# Patient Record
Sex: Female | Born: 1970 | Race: White | Hispanic: No | Marital: Single | Smoking: Never smoker
Health system: Southern US, Community
[De-identification: ages and names within clinical notes are randomized; demographics above are authoritative.]

## PROBLEM LIST (undated history)

## (undated) DIAGNOSIS — K219 Gastro-esophageal reflux disease without esophagitis: Secondary | ICD-10-CM

## (undated) DIAGNOSIS — G4700 Insomnia, unspecified: Secondary | ICD-10-CM

## (undated) DIAGNOSIS — E039 Hypothyroidism, unspecified: Secondary | ICD-10-CM

## (undated) DIAGNOSIS — F319 Bipolar disorder, unspecified: Secondary | ICD-10-CM

## (undated) DIAGNOSIS — K589 Irritable bowel syndrome without diarrhea: Secondary | ICD-10-CM

## (undated) HISTORY — PX: OTHER SURGICAL HISTORY: SHX169

## (undated) HISTORY — DX: Insomnia, unspecified: G47.00

## (undated) HISTORY — DX: Hypothyroidism, unspecified: E03.9

## (undated) HISTORY — PX: CARPAL TUNNEL RELEASE: SHX101

## (undated) HISTORY — DX: Irritable bowel syndrome, unspecified: K58.9

## (undated) HISTORY — PX: VENTRAL HERNIA REPAIR: SHX424

## (undated) HISTORY — DX: Gastro-esophageal reflux disease without esophagitis: K21.9

---

## 2008-04-20 HISTORY — PX: TUBAL LIGATION: SHX77

## 2008-10-27 HISTORY — PX: LAPAROSCOPIC CHOLECYSTECTOMY: SUR755

## 2012-09-15 ENCOUNTER — Other Ambulatory Visit (HOSPITAL_COMMUNITY): Payer: Self-pay

## 2012-09-15 DIAGNOSIS — M545 Low back pain: Secondary | ICD-10-CM

## 2012-09-17 ENCOUNTER — Ambulatory Visit (HOSPITAL_COMMUNITY): Admission: RE | Admit: 2012-09-17 | Payer: Self-pay | Source: Ambulatory Visit

## 2012-12-08 ENCOUNTER — Other Ambulatory Visit: Payer: Self-pay | Admitting: Neurosurgery

## 2012-12-08 DIAGNOSIS — M545 Low back pain: Secondary | ICD-10-CM

## 2012-12-11 ENCOUNTER — Other Ambulatory Visit: Payer: Self-pay | Admitting: Neurosurgery

## 2012-12-11 ENCOUNTER — Ambulatory Visit
Admission: RE | Admit: 2012-12-11 | Discharge: 2012-12-11 | Disposition: A | Discharge status: Home / Self Care | Payer: Medicaid Other | Source: Ambulatory Visit | Attending: Neurosurgery | Admitting: Neurosurgery

## 2012-12-11 DIAGNOSIS — M545 Low back pain: Secondary | ICD-10-CM

## 2013-08-02 IMAGING — CR DG CERVICAL SPINE COMPLETE 4+V
7 series · 7 of 7 positions shown · non-contrast
Comparison: None.

CLINICAL DATA: Right-sided neck pain and right arm pain for 5
months.

CERVICAL SPINE - COMPLETE 4+ VIEW

[view not recorded (1 of 7)]
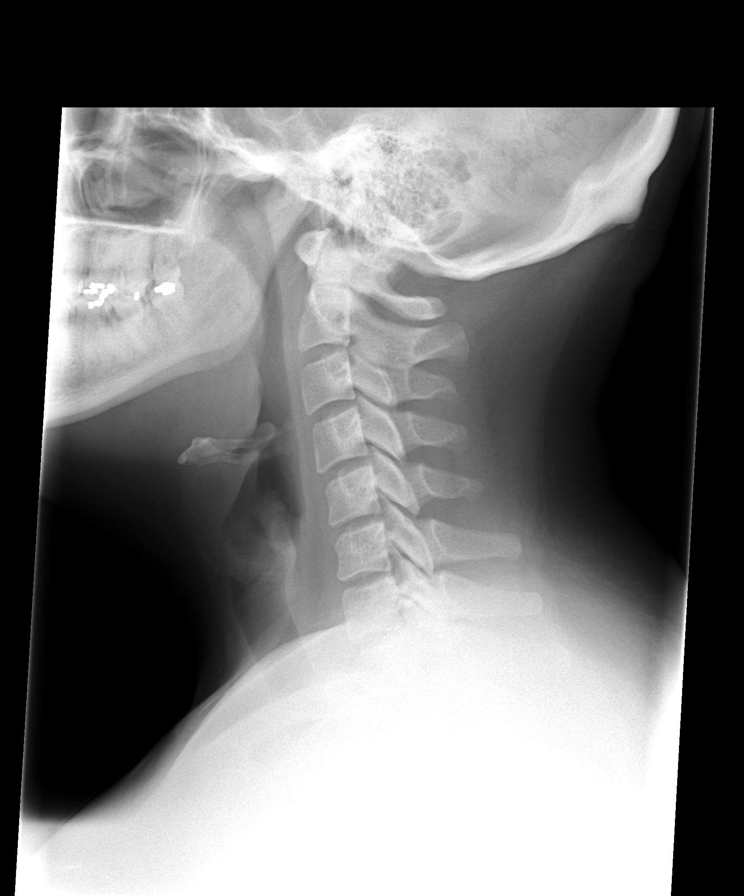

[view not recorded (2 of 7)]
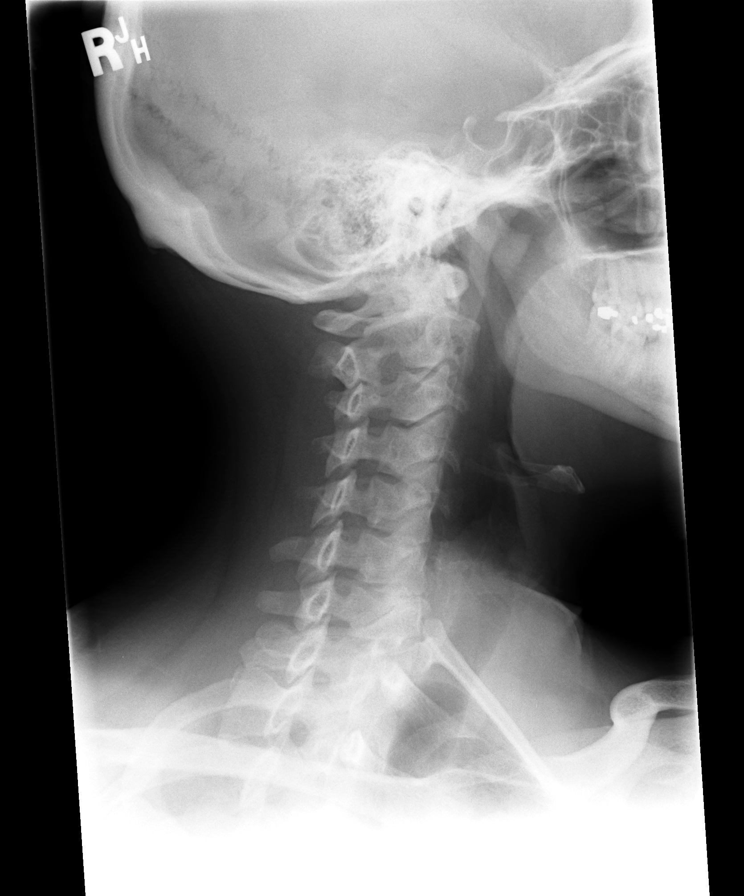

[view not recorded (3 of 7)]
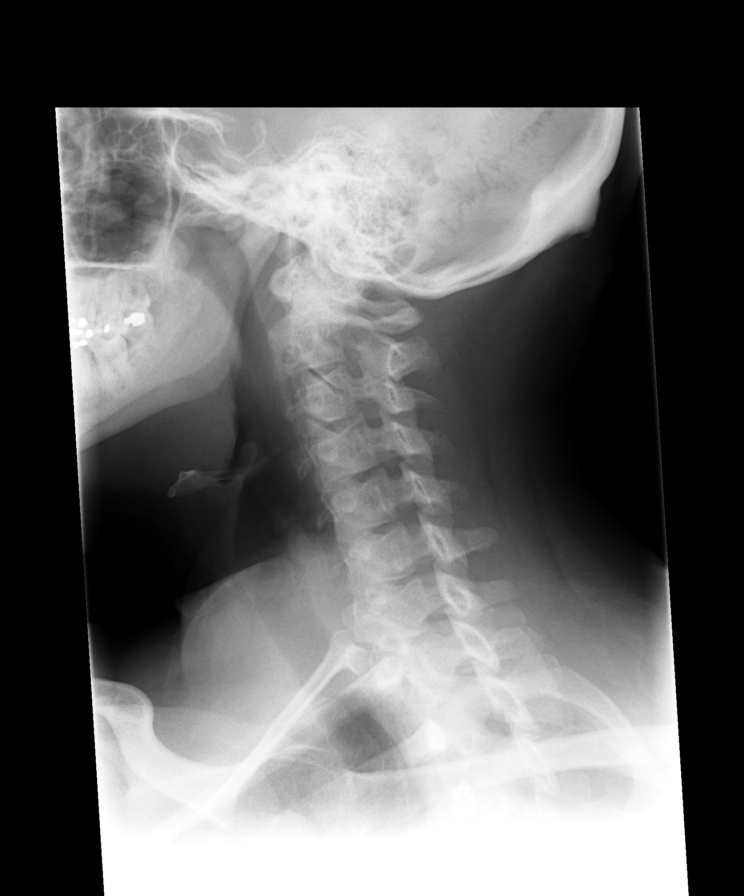

[view not recorded (4 of 7)]
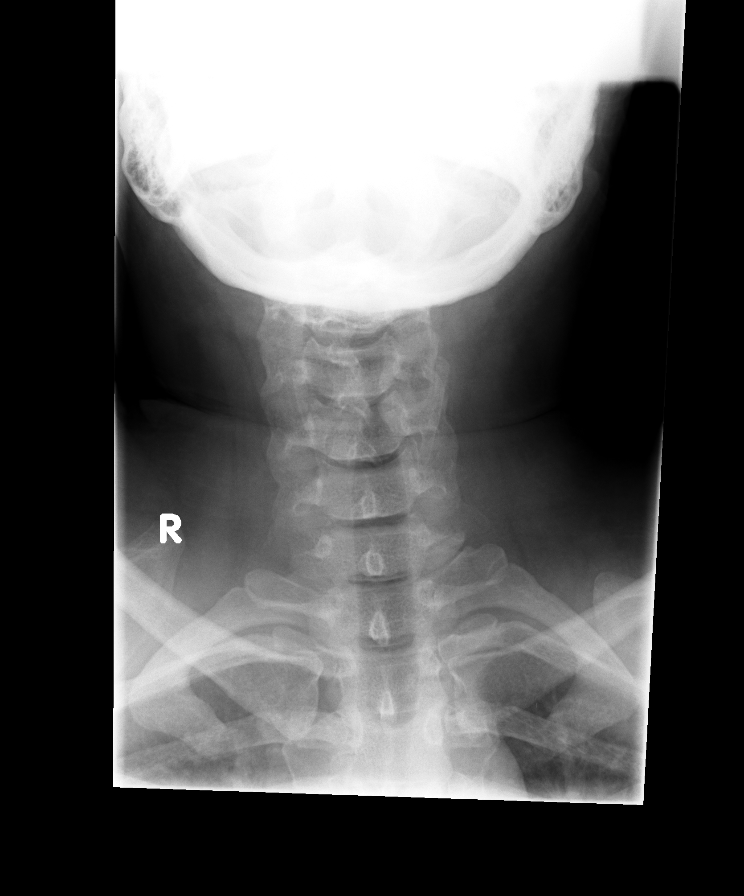

[view not recorded (5 of 7)]
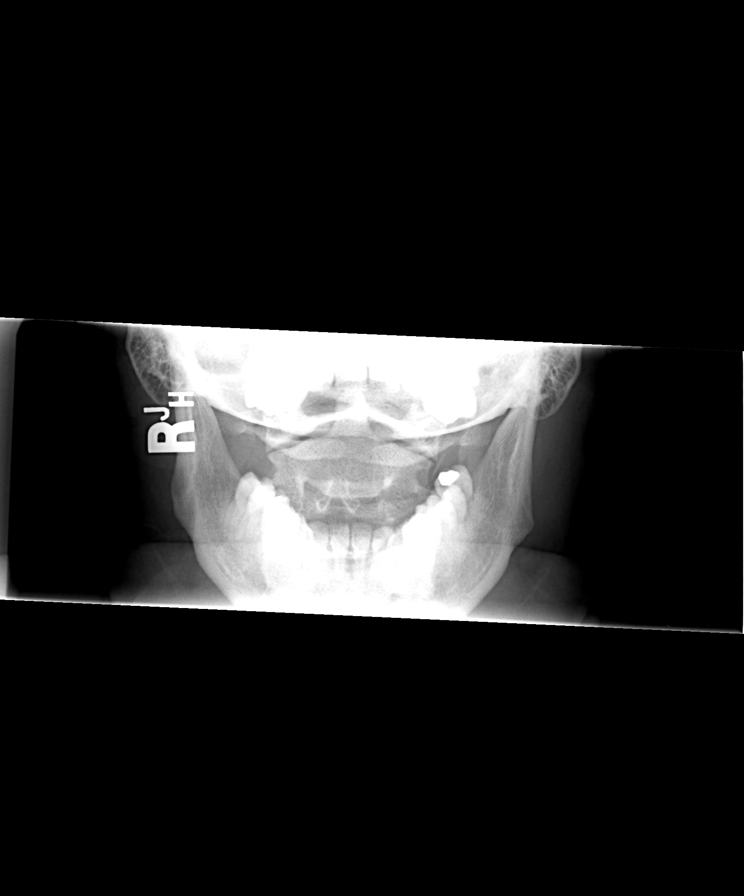

[view not recorded (6 of 7)]
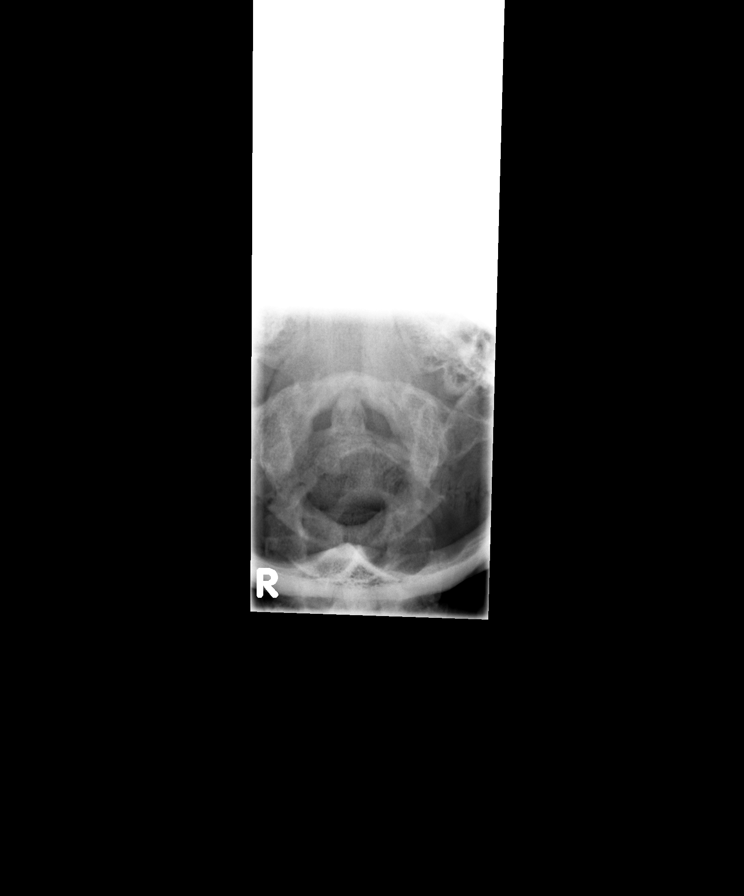

[view not recorded (7 of 7)]
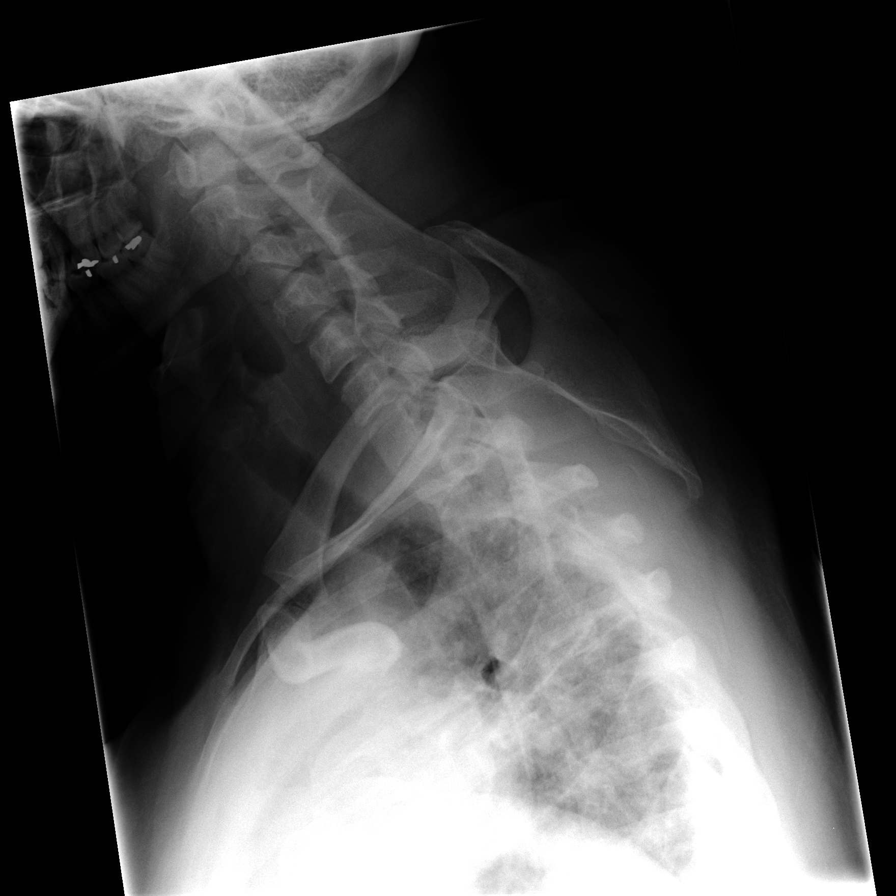

[7 of 7 positions shown; findings below may reference images not displayed]

FINDINGS: There is no disc space narrowing, arthritis, prevertebral
soft tissue swelling, or other abnormality. No foraminal stenosis.
IMPRESSION: Normal cervical spine.

## 2013-08-21 HISTORY — PX: COLONOSCOPY: SHX174

## 2015-03-06 ENCOUNTER — Emergency Department (HOSPITAL_COMMUNITY): Payer: Medicaid Other

## 2015-03-06 ENCOUNTER — Emergency Department (HOSPITAL_COMMUNITY)
Admission: EM | Admit: 2015-03-06 | Discharge: 2015-03-06 | Disposition: A | Discharge status: Home / Self Care | Payer: Medicaid Other | Attending: Emergency Medicine | Admitting: Emergency Medicine

## 2015-03-06 ENCOUNTER — Encounter (HOSPITAL_COMMUNITY): Payer: Self-pay | Admitting: *Deleted

## 2015-03-06 DIAGNOSIS — Y998 Other external cause status: Secondary | ICD-10-CM | POA: Insufficient documentation

## 2015-03-06 DIAGNOSIS — S46012A Strain of muscle(s) and tendon(s) of the rotator cuff of left shoulder, initial encounter: Secondary | ICD-10-CM | POA: Diagnosis not present

## 2015-03-06 DIAGNOSIS — S43422A Sprain of left rotator cuff capsule, initial encounter: Secondary | ICD-10-CM | POA: Insufficient documentation

## 2015-03-06 DIAGNOSIS — Y929 Unspecified place or not applicable: Secondary | ICD-10-CM | POA: Diagnosis not present

## 2015-03-06 DIAGNOSIS — X58XXXA Exposure to other specified factors, initial encounter: Secondary | ICD-10-CM | POA: Diagnosis not present

## 2015-03-06 DIAGNOSIS — Y939 Activity, unspecified: Secondary | ICD-10-CM | POA: Diagnosis not present

## 2015-03-06 DIAGNOSIS — S4992XA Unspecified injury of left shoulder and upper arm, initial encounter: Secondary | ICD-10-CM | POA: Diagnosis present

## 2015-03-06 MED ORDER — HYDROMORPHONE HCL 1 MG/ML IJ SOLN
1.0000 mg | Freq: Once | INTRAMUSCULAR | Status: AC
  Administered 2015-03-06: 1 mg via INTRAMUSCULAR
  Filled 2015-03-06: qty 1

## 2015-03-06 MED ORDER — HYDROCODONE-ACETAMINOPHEN 5-325 MG PO TABS: 1.0000 | ORAL_TABLET | ORAL | Status: DC | PRN

## 2015-03-06 MED ORDER — NAPROXEN 500 MG PO TABS: 500.0000 mg | ORAL_TABLET | Freq: Two times a day (BID) | ORAL | Status: DC

## 2015-03-06 NOTE — ED Notes (Signed)
Pt to xray

## 2015-03-06 NOTE — Discharge Instructions (Signed)
Rotator Cuff Injury Rotator cuff injury is any type of injury to the set of muscles and tendons that make up the stabilizing unit of your shoulder. This unit holds the ball of your upper arm bone (humerus) in the socket of your shoulder blade (scapula).  CAUSES Injuries to your rotator cuff most commonly come from sports or activities that cause your arm to be moved repeatedly over your head. Examples of this include throwing, weight lifting, swimming, or racquet sports. Long lasting (chronic) irritation of your rotator cuff can cause soreness and swelling (inflammation), bursitis, and eventual damage to your tendons, such as a tear (rupture). SIGNS AND SYMPTOMS Acute rotator cuff tear:  Sudden tearing sensation followed by severe pain shooting from your upper shoulder down your arm toward your elbow.  Decreased range of motion of your shoulder because of pain and muscle spasm.  Severe pain.  Inability to raise your arm out to the side because of pain and loss of muscle power (large tears). Chronic rotator cuff tear:  Pain that usually is worse at night and may interfere with sleep.  Gradual weakness and decreased shoulder motion as the pain worsens.  Decreased range of motion. Rotator cuff tendinitis:  Deep ache in your shoulder and the outside upper arm over your shoulder.  Pain that comes on gradually and becomes worse when lifting your arm to the side or turning it inward. DIAGNOSIS Rotator cuff injury is diagnosed through a medical history, physical exam, and imaging exam. The medical history helps determine the type of rotator cuff injury. Your health care provider will look at your injured shoulder, feel the injured area, and ask you to move your shoulder in different positions. X-ray exams typically are done to rule out other causes of shoulder pain, such as fractures. MRI is the exam of choice for the most severe shoulder injuries because the images show muscles and tendons.    TREATMENT  Chronic tear:  Medicine for pain, such as acetaminophen or ibuprofen.  Physical therapy and range-of-motion exercises may be helpful in maintaining shoulder function and strength.  Steroid injections into your shoulder joint.  Surgical repair of the rotator cuff if the injury does not heal with noninvasive treatment. Acute tear:  Anti-inflammatory medicines such as ibuprofen and naproxen to help reduce pain and swelling.  A sling to help support your arm and rest your rotator cuff muscles. Long-term use of a sling is not advised. It may cause significant stiffening of the shoulder joint.  Surgery may be considered within a few weeks, especially in younger, active people, to return the shoulder to full function.  Indications for surgical treatment include the following:  Age younger than 60 years.  Rotator cuff tears that are complete.  Physical therapy, rest, and anti-inflammatory medicines have been used for 6-8 weeks, with no improvement.  Employment or sporting activity that requires constant shoulder use. Tendinitis:  Anti-inflammatory medicines such as ibuprofen and naproxen to help reduce pain and swelling.  A sling to help support your arm and rest your rotator cuff muscles. Long-term use of a sling is not advised. It may cause significant stiffening of the shoulder joint.  Severe tendinitis may require:  Steroid injections into your shoulder joint.  Physical therapy.  Surgery. HOME CARE INSTRUCTIONS   Apply ice to your injury:  Put ice in a plastic bag.  Place a towel between your skin and the bag.  Leave the ice on for 20 minutes, 2-3 times a day.  If you   have a shoulder immobilizer (sling and straps), wear it until told otherwise by your health care provider.  You may want to sleep on several pillows or in a recliner at night to lessen swelling and pain.  Only take over-the-counter or prescription medicines for pain, discomfort, or fever as  directed by your health care provider.  Do simple hand squeezing exercises with a soft rubber ball to decrease hand swelling. SEEK MEDICAL CARE IF:   Your shoulder pain increases, or new pain or numbness develops in your arm, hand, or fingers.  Your hand or fingers are colder than your other hand. SEEK IMMEDIATE MEDICAL CARE IF:   Your arm, hand, or fingers are numb or tingling.  Your arm, hand, or fingers are increasingly swollen and painful, or they turn white or blue. MAKE SURE YOU:  Understand these instructions.  Will watch your condition.  Will get help right away if you are not doing well or get worse. Document Released: 11/02/2000 Document Revised: 11/10/2013 Document Reviewed: 06/17/2013 [employer] Patient Information 2015 [employer]. This information is not intended to replace advice given to you by your health care provider. Make sure you discuss any questions you have with your health care provider.  

## 2015-03-06 NOTE — ED Notes (Signed)
The pt uis c/o lkt shoulder pain for one year.  She woke uyp this am  With more pain than usual and some numbness and mimimal motiion.  shge is ahving this evaluated presently.  lmp  none
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
## 2015-05-11 ENCOUNTER — Emergency Department (HOSPITAL_COMMUNITY): Payer: Medicaid Other

## 2015-05-11 ENCOUNTER — Emergency Department (HOSPITAL_COMMUNITY)
Admission: EM | Admit: 2015-05-11 | Discharge: 2015-05-11 | Disposition: A | Discharge status: Home / Self Care | Payer: Medicaid Other | Attending: Emergency Medicine | Admitting: Emergency Medicine

## 2015-05-11 ENCOUNTER — Encounter (HOSPITAL_COMMUNITY): Payer: Self-pay | Admitting: *Deleted

## 2015-05-11 DIAGNOSIS — Z8659 Personal history of other mental and behavioral disorders: Secondary | ICD-10-CM | POA: Diagnosis not present

## 2015-05-11 DIAGNOSIS — R1032 Left lower quadrant pain: Secondary | ICD-10-CM | POA: Diagnosis not present

## 2015-05-11 DIAGNOSIS — Z90710 Acquired absence of both cervix and uterus: Secondary | ICD-10-CM | POA: Diagnosis not present

## 2015-05-11 DIAGNOSIS — R319 Hematuria, unspecified: Secondary | ICD-10-CM | POA: Insufficient documentation

## 2015-05-11 HISTORY — DX: Bipolar disorder, unspecified: F31.9

## 2015-05-11 LAB — CBC WITH DIFFERENTIAL/PLATELET
Basophils Absolute: 0 K/uL (ref 0.0–0.1)
Basophils Relative: 0 % (ref 0–1)
Eosinophils Absolute: 0.1 K/uL (ref 0.0–0.7)
Eosinophils Relative: 2 % (ref 0–5)
HCT: 36.5 % (ref 36.0–46.0)
Hemoglobin: 12.6 g/dL (ref 12.0–15.0)
Lymphocytes Relative: 26 % (ref 12–46)
Lymphs Abs: 1.9 K/uL (ref 0.7–4.0)
MCH: 31.6 pg (ref 26.0–34.0)
MCHC: 34.5 g/dL (ref 30.0–36.0)
MCV: 91.5 fL (ref 78.0–100.0)
Monocytes Absolute: 0.5 K/uL (ref 0.1–1.0)
Monocytes Relative: 7 % (ref 3–12)
Neutro Abs: 4.7 K/uL (ref 1.7–7.7)
Neutrophils Relative %: 65 % (ref 43–77)
Platelets: 290 K/uL (ref 150–400)
RBC: 3.99 MIL/uL (ref 3.87–5.11)
RDW: 12.2 % (ref 11.5–15.5)
WBC: 7.3 K/uL (ref 4.0–10.5)

## 2015-05-11 LAB — COMPREHENSIVE METABOLIC PANEL WITH GFR
ALT: 22 U/L (ref 14–54)
AST: 21 U/L (ref 15–41)
Albumin: 3.9 g/dL (ref 3.5–5.0)
Alkaline Phosphatase: 60 U/L (ref 38–126)
Anion gap: 9 (ref 5–15)
BUN: 12 mg/dL (ref 6–20)
CO2: 26 mmol/L (ref 22–32)
Calcium: 9.5 mg/dL (ref 8.9–10.3)
Chloride: 107 mmol/L (ref 101–111)
Creatinine, Ser: 0.64 mg/dL (ref 0.44–1.00)
GFR calc Af Amer: >60 mL/min
GFR calc non Af Amer: >60 mL/min
Glucose, Bld: 86 mg/dL (ref 65–99)
Potassium: 3.7 mmol/L (ref 3.5–5.1)
Sodium: 142 mmol/L (ref 135–145)
Total Bilirubin: 0.5 mg/dL (ref 0.3–1.2)
Total Protein: 6.9 g/dL (ref 6.5–8.1)

## 2015-05-11 LAB — URINALYSIS, ROUTINE W REFLEX MICROSCOPIC
Bilirubin Urine: NEGATIVE
Glucose, UA: NEGATIVE mg/dL
Ketones, ur: NEGATIVE mg/dL
Leukocytes, UA: NEGATIVE
Nitrite: NEGATIVE
Protein, ur: NEGATIVE mg/dL
Specific Gravity, Urine: 1.017 (ref 1.005–1.030)
Urobilinogen, UA: 0.2 mg/dL (ref 0.0–1.0)
pH: 6 (ref 5.0–8.0)

## 2015-05-11 LAB — URINE MICROSCOPIC-ADD ON

## 2015-05-11 LAB — LIPASE, BLOOD: Lipase: 32 U/L (ref 22–51)

## 2015-05-11 MED ORDER — IOHEXOL 300 MG/ML  SOLN
100.0000 mL | Freq: Once | INTRAMUSCULAR | Status: AC | PRN
  Administered 2015-05-11: 100 mL via INTRAVENOUS

## 2015-05-11 MED ORDER — IBUPROFEN 800 MG PO TABS: 800.0000 mg | ORAL_TABLET | Freq: Three times a day (TID) | ORAL | Status: AC

## 2015-05-11 MED ORDER — HYDROCODONE-ACETAMINOPHEN 5-325 MG PO TABS: 1.0000 | ORAL_TABLET | Freq: Four times a day (QID) | ORAL | Status: AC | PRN

## 2015-05-11 MED ORDER — HYDROMORPHONE HCL 1 MG/ML IJ SOLN
1.0000 mg | Freq: Once | INTRAMUSCULAR | Status: AC
  Administered 2015-05-11: 1 mg via INTRAVENOUS
  Filled 2015-05-11: qty 1

## 2015-05-11 MED ORDER — ONDANSETRON HCL 4 MG/2ML IJ SOLN
4.0000 mg | Freq: Once | INTRAMUSCULAR | Status: AC
  Administered 2015-05-11: 4 mg via INTRAVENOUS
  Filled 2015-05-11: qty 2

## 2015-05-11 MED ORDER — IOHEXOL 300 MG/ML  SOLN
25.0000 mL | Freq: Once | INTRAMUSCULAR | Status: AC | PRN
  Administered 2015-05-11: 25 mL via ORAL

## 2015-05-11 MED ORDER — SODIUM CHLORIDE 0.9 % IV SOLN
INTRAVENOUS | Status: DC
  Administered 2015-05-11: 17:00:00 via INTRAVENOUS

## 2015-05-11 NOTE — ED Provider Notes (Signed)
CSN: [id number]     Arrival date & time 05/11/15  1525 History   This chart was scribed for non-physician practitioner,Mandee Pluta M. Damian Leavell, NP, working with Bethann Berkshire, MD, by Budd Palmer ED Scribe. This patient was seen in room TR09C/TR09C and the patient's care was started at 4:20 PM    Chief Complaint  Patient presents with  . Abdominal Pain   The history is provided by the patient. No language interpreter was used.   HPI Comments: Jennifer Hutchinson is a 44 y.o. female who presents to the Emergency Department complaining of progressively worsening, cramping, continuous, left-sided abdominal pain, onset in 11/2014.  She also notes intermittent radiation to her left lower back and down her left leg.  The patient notes a noticeable abdominal bulge if she strains while using the restroom.  The pain is relieved by lying supine, and applying heat. Physical strain and standing up exacerbates the pain. She reports a history of complete laparoscopic abdominal hysterectomy for endometriosis 10/2014, as well as a C-section 7 years ago.   Past Medical History  Diagnosis Date  . Bipolar 1 disorder    History reviewed. No pertinent past surgical history. History reviewed. No pertinent family history. History  Substance Use Topics  . Smoking status: Never Smoker   . Smokeless tobacco: Not on file  . Alcohol Use: Yes   OB History    No data available     Review of Systems  Gastrointestinal: Positive for abdominal pain.  Skin: Negative for wound.  all other systems negative    Allergies  Lamictal  Home Medications   Prior to Admission medications   Medication Sig Start Date End Date Taking? Authorizing Provider  HYDROcodone-acetaminophen (NORCO) 5-325 MG per tablet Take 1 tablet by mouth every 6 (six) hours as needed for moderate pain. 05/11/15   Johaan Ryser Orlene Och, NP  ibuprofen (ADVIL,MOTRIN) 800 MG tablet Take 1 tablet (800 mg total) by mouth 3 (three) times daily. 05/11/15   Brandon Scarbrough Orlene Och, NP    BP 111/62 mmHg  Pulse 61  Temp(Src) 97.9 F (36.6 C) (Oral)  Resp 20  Ht  (1.626 m)  Wt 175 lb (79.379 kg)  BMI 30.02 kg/m2  SpO2 100% Physical Exam  Constitutional: She is oriented to person, place, and time. She appears well-developed and well-nourished. No distress.  HENT:  Head: Normocephalic and atraumatic.  Eyes: Conjunctivae and EOM are normal.  Neck: Normal range of motion. Neck supple. No tracheal deviation present.  Cardiovascular: Normal rate and regular rhythm.   Pulmonary/Chest: Effort normal and breath sounds normal.  Lungs are clear  Abdominal: Soft. Bowel sounds are normal. There is tenderness (LLQ). There is no rebound and no guarding.  Genitourinary:  No CVA tenderness  Musculoskeletal: Normal range of motion.  Neurological: She is alert and oriented to person, place, and time.  Skin: Skin is warm and dry.  Psychiatric: She has a normal mood and affect. Her behavior is normal.  Nursing note and vitals reviewed.   ED Course  Procedures  DIAGNOSTIC STUDIES: Oxygen Saturation is 100% on RA, normal by my interpretation.    COORDINATION OF CARE: 4:24 PM - Discussed plans to order pain medication, and possible CT of abdomen. Pt advised of plan for treatment and pt agrees.  Labs Review Results for orders placed or performed during the hospital encounter of 05/11/15 (from the past 24 hour(s))  Urinalysis, Routine w reflex microscopic (not at [facility])     Status: Abnormal   Collection  Time: 05/11/15  3:35 PM  Result Value Ref Range   Color, Urine YELLOW YELLOW   APPearance CLEAR CLEAR   Specific Gravity, Urine 1.017 1.005 - 1.030   pH 6.0 5.0 - 8.0   Glucose, UA NEGATIVE NEGATIVE mg/dL   Hgb urine dipstick TRACE (A) NEGATIVE   Bilirubin Urine NEGATIVE NEGATIVE   Ketones, ur NEGATIVE NEGATIVE mg/dL   Protein, ur NEGATIVE NEGATIVE mg/dL   Urobilinogen, UA 0.2 0.0 - 1.0 mg/dL   Nitrite NEGATIVE NEGATIVE   Leukocytes, UA NEGATIVE NEGATIVE  Urine  microscopic-add on     Status: Abnormal   Collection Time: 05/11/15  3:35 PM  Result Value Ref Range   Squamous Epithelial / LPF FEW (A) RARE   WBC, UA 0-2 <3 WBC/hpf   RBC / HPF 0-2 <3 RBC/hpf   Bacteria, UA RARE RARE  CBC WITH DIFFERENTIAL     Status: None   Collection Time: 05/11/15  4:00 PM  Result Value Ref Range   WBC 7.3 4.0 - 10.5 K/uL   RBC 3.99 3.87 - 5.11 MIL/uL   Hemoglobin 12.6 12.0 - 15.0 g/dL   HCT 96.0 45.4 - 09.8 %   MCV 91.5 78.0 - 100.0 fL   MCH 31.6 26.0 - 34.0 pg   MCHC 34.5 30.0 - 36.0 g/dL   RDW 11.9 14.7 - 82.9 %   Platelets 290 150 - 400 K/uL   Neutrophils Relative % 65 43 - 77 %   Neutro Abs 4.7 1.7 - 7.7 K/uL   Lymphocytes Relative 26 12 - 46 %   Lymphs Abs 1.9 0.7 - 4.0 K/uL   Monocytes Relative 7 3 - 12 %   Monocytes Absolute 0.5 0.1 - 1.0 K/uL   Eosinophils Relative 2 0 - 5 %   Eosinophils Absolute 0.1 0.0 - 0.7 K/uL   Basophils Relative 0 0 - 1 %   Basophils Absolute 0.0 0.0 - 0.1 K/uL  Comprehensive metabolic panel     Status: None   Collection Time: 05/11/15  4:00 PM  Result Value Ref Range   Sodium 142 135 - 145 mmol/L   Potassium 3.7 3.5 - 5.1 mmol/L   Chloride 107 101 - 111 mmol/L   CO2 26 22 - 32 mmol/L   Glucose, Bld 86 65 - 99 mg/dL   BUN 12 6 - 20 mg/dL   Creatinine, Ser 5.62 0.44 - 1.00 mg/dL   Calcium 9.5 8.9 - 13.0 mg/dL   Total Protein 6.9 6.5 - 8.1 g/dL   Albumin 3.9 3.5 - 5.0 g/dL   AST 21 15 - 41 U/L   ALT 22 14 - 54 U/L   Alkaline Phosphatase 60 38 - 126 U/L   Total Bilirubin 0.5 0.3 - 1.2 mg/dL   GFR calc non Af Amer >60 >60 mL/min   GFR calc Af Amer >60 >60 mL/min   Anion gap 9 5 - 15  Lipase, blood     Status: None   Collection Time: 05/11/15  4:00 PM  Result Value Ref Range   Lipase 32 22 - 51 U/L     Imaging Review Ct Abdomen Pelvis W Contrast  05/11/2015   CLINICAL DATA:  Left lower quadrant pain since January. Diarrhea 2 weeks ago. Hysterectomy in December.  EXAM: CT ABDOMEN AND PELVIS WITH CONTRAST   TECHNIQUE: Multidetector CT imaging of the abdomen and pelvis was performed using the standard protocol following bolus administration of intravenous contrast.  CONTRAST:  OMNIPAQUE IOHEXOL 300 MG/ML  SOLN  COMPARISON:  None.  FINDINGS: Lower chest: 4 mm anterior right lung base nodule on image 2. Subpleural 3 mm left lower lobe pulmonary nodule on image 6. Normal heart size without pericardial or pleural effusion.  Hepatobiliary: Normal liver. Cholecystectomy, without biliary ductal dilatation.  Pancreas: Normal, without mass or ductal dilatation.  Spleen: Normal  Adrenals/Urinary Tract: Normal adrenal glands. Too small to characterize lesions in both kidneys, likely cysts. Punctate left renal collecting system calculus on coronal image 57. No hydronephrosis. Normal ureters and urinary bladder.  Stomach/Bowel: Normal stomach, without wall thickening. Colonic stool burden suggests constipation. Normal terminal ileum and appendix. Normal small bowel.  Vascular/Lymphatic: Normal caliber of the aorta and branch vessels. No abdominopelvic adenopathy.  Reproductive: Hysterectomy.  No adnexal mass.  Other: No significant free fluid.  Musculoskeletal: L4-5 and L5-S1 mild disc bulges.  IMPRESSION: 1. Left nephrolithiasis. 2.  Possible constipation. 3. Bibasilar lung nodules up to 4 mm. If the patient is at high risk for bronchogenic carcinoma, follow-up chest CT at 1 year is recommended. If the patient is at low risk, no follow-up is needed. This recommendation follows the consensus statement: "Guidelines for Management of Small Pulmonary Nodules Detected on CT Scans: A Statement from the Fleischner Society" as published in Radiology 2005; 237:395-400. Available online at: DietDisorder.cz.   Electronically Signed   By: Jeronimo Greaves M.D.   On: 05/11/2015 17:47   I discussed this case with Dr. Estell Harpin and he reviewed the CT scan. Patient may have passed a stone. Will have her  follow up with urology since there are stones noted in the kidney. Will have patient follow up with her PCP for the basilar lung nodules.   MDM  44 y.o. female with LLQ abdominal pain and hematuria. Stable for d/c without obstruction. Discussed with the patient clinical, lab and CT findings and plan of care. All questioned fully answered. She will follow up with urology for the hematuria and with her PCP for the pulmonary nodules. She will return here if any problems arise.  Final diagnoses:  Abdominal pain, left lower quadrant  Hematuria   I personally performed the services described in this documentation, which was scribed in my presence. The recorded information has been reviewed and is accurate.   [address] [place], [place] 05/11/15 2332  Bethann Berkshire, MD 05/13/15 [phone]

## 2015-05-11 NOTE — ED Notes (Signed)
Pt in c/o swollen area to RLQ, near her c-section scar, first noted a few weeks ago and it has been progressively worse, increased pain, area used to reduce in swelling but now it is staying out, reports diarrhea, no vomiting, no distress noted

## 2015-05-11 NOTE — Discharge Instructions (Signed)
Take a stool softener and eat a diet high in fiber.    Abdominal Pain, Women Abdominal (stomach, pelvic, or belly) pain can be caused by many things. It is important to tell your doctor:  The location of the pain.  Does it come and go or is it present all the time?  Are there things that start the pain (eating certain foods, exercise)?  Are there other symptoms associated with the pain (fever, nausea, vomiting, diarrhea)? All of this is helpful to know when trying to find the cause of the pain. CAUSES   Stomach: virus or bacteria infection, or ulcer.  Intestine: appendicitis (inflamed appendix), regional ileitis (Crohn's disease), ulcerative colitis (inflamed colon), irritable bowel syndrome, diverticulitis (inflamed diverticulum of the colon), or cancer of the stomach or intestine.  Gallbladder disease or stones in the gallbladder.  Kidney disease, kidney stones, or infection.  Pancreas infection or cancer.  Fibromyalgia (pain disorder).  Diseases of the female organs:  Uterus: fibroid (non-cancerous) tumors or infection.  Fallopian tubes: infection or tubal pregnancy.  Ovary: cysts or tumors.  Pelvic adhesions (scar tissue).  Endometriosis (uterus lining tissue growing in the pelvis and on the pelvic organs).  Pelvic congestion syndrome (female organs filling up with blood just before the menstrual period).  Pain with the menstrual period.  Pain with ovulation (producing an egg).  Pain with an IUD (intrauterine device, birth control) in the uterus.  Cancer of the female organs.  Functional pain (pain not caused by a disease, may improve without treatment).  Psychological pain.  Depression. DIAGNOSIS  Your doctor will decide the seriousness of your pain by doing an examination.  Blood tests.  X-rays.  Ultrasound.  CT scan (computed tomography, special type of X-ray).  MRI (magnetic resonance imaging).  Cultures, for infection.  Barium enema (dye  inserted in the large intestine, to better view it with X-rays).  Colonoscopy (looking in intestine with a lighted tube).  Laparoscopy (minor surgery, looking in abdomen with a lighted tube).  Major abdominal exploratory surgery (looking in abdomen with a large incision). TREATMENT  The treatment will depend on the cause of the pain.   Many cases can be observed and treated at home.  Over-the-counter medicines recommended by your caregiver.  Prescription medicine.  Antibiotics, for infection.  Birth control pills, for painful periods or for ovulation pain.  Hormone treatment, for endometriosis.  Nerve blocking injections.  Physical therapy.  Antidepressants.  Counseling with a psychologist or psychiatrist.  Minor or major surgery. HOME CARE INSTRUCTIONS   Do not take laxatives, unless directed by your caregiver.  Take over-the-counter pain medicine only if ordered by your caregiver. Do not take aspirin because it can cause an upset stomach or bleeding.  Try a clear liquid diet (broth or water) as ordered by your caregiver. Slowly move to a bland diet, as tolerated, if the pain is related to the stomach or intestine.  Have a thermometer and take your temperature several times a day, and record it.  Bed rest and sleep, if it helps the pain.  Avoid sexual intercourse, if it causes pain.  Avoid stressful situations.  Keep your follow-up appointments and tests, as your caregiver orders.  If the pain does not go away with medicine or surgery, you may try:  Acupuncture.  Relaxation exercises (yoga, meditation).  Group therapy.  Counseling. SEEK MEDICAL CARE IF:   You notice certain foods cause stomach pain.  Your home care treatment is not helping your pain.  You need  stronger pain medicine.  You want your IUD removed.  You feel faint or lightheaded.  You develop nausea and vomiting.  You develop a rash.  You are having side effects or an allergy to  your medicine. SEEK IMMEDIATE MEDICAL CARE IF:   Your pain does not go away or gets worse.  You have a fever.  Your pain is felt only in portions of the abdomen. The right side could possibly be appendicitis. The left lower portion of the abdomen could be colitis or diverticulitis.  You are passing blood in your stools (bright red or black tarry stools, with or without vomiting).  You have blood in your urine.  You develop chills, with or without a fever.  You pass out. MAKE SURE YOU:   Understand these instructions.  Will watch your condition.  Will get help right away if you are not doing well or get worse. Document Released: 09/02/2007 Document Revised: 03/22/2014 Document Reviewed: 09/22/2009 [facility] Patient Information 2015 [place], [place]. This information is not intended to replace advice given to you by your health care provider. Make sure you discuss any questions you have with your health care provider.

## 2015-05-11 NOTE — ED Notes (Signed)
NP at bedside.
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
## 2019-11-19 ENCOUNTER — Encounter: Payer: Self-pay | Admitting: Gastroenterology

## 2019-12-08 ENCOUNTER — Ambulatory Visit: Payer: Self-pay | Admitting: Gastroenterology

## 2020-01-14 ENCOUNTER — Encounter: Payer: Self-pay | Admitting: Family Medicine

## 2023-10-01 ENCOUNTER — Encounter: Payer: Self-pay | Admitting: Gastroenterology

## 2024-11-19 ENCOUNTER — Other Ambulatory Visit: Payer: Self-pay

## 2024-11-19 ENCOUNTER — Observation Stay (HOSPITAL_COMMUNITY)
Admission: EM | Admit: 2024-11-19 | Discharge: 2024-11-21 | Disposition: A | Attending: Internal Medicine | Admitting: Internal Medicine

## 2024-11-19 ENCOUNTER — Encounter (HOSPITAL_COMMUNITY): Payer: Self-pay

## 2024-11-19 DIAGNOSIS — R109 Unspecified abdominal pain: Secondary | ICD-10-CM | POA: Diagnosis present

## 2024-11-19 DIAGNOSIS — K219 Gastro-esophageal reflux disease without esophagitis: Secondary | ICD-10-CM | POA: Diagnosis present

## 2024-11-19 DIAGNOSIS — R911 Solitary pulmonary nodule: Secondary | ICD-10-CM | POA: Insufficient documentation

## 2024-11-19 DIAGNOSIS — R748 Abnormal levels of other serum enzymes: Secondary | ICD-10-CM | POA: Diagnosis not present

## 2024-11-19 DIAGNOSIS — R918 Other nonspecific abnormal finding of lung field: Secondary | ICD-10-CM | POA: Diagnosis present

## 2024-11-19 DIAGNOSIS — N200 Calculus of kidney: Secondary | ICD-10-CM

## 2024-11-19 DIAGNOSIS — Z79899 Other long term (current) drug therapy: Secondary | ICD-10-CM | POA: Insufficient documentation

## 2024-11-19 DIAGNOSIS — K5900 Constipation, unspecified: Secondary | ICD-10-CM | POA: Diagnosis present

## 2024-11-19 DIAGNOSIS — F319 Bipolar disorder, unspecified: Secondary | ICD-10-CM | POA: Diagnosis not present

## 2024-11-19 DIAGNOSIS — R7401 Elevation of levels of liver transaminase levels: Secondary | ICD-10-CM | POA: Diagnosis not present

## 2024-11-19 DIAGNOSIS — R1084 Generalized abdominal pain: Secondary | ICD-10-CM | POA: Diagnosis present

## 2024-11-19 DIAGNOSIS — E039 Hypothyroidism, unspecified: Secondary | ICD-10-CM | POA: Insufficient documentation

## 2024-11-19 LAB — URINALYSIS, ROUTINE W REFLEX MICROSCOPIC
Bacteria, UA: NONE SEEN
Bilirubin Urine: NEGATIVE
Glucose, UA: NEGATIVE mg/dL
Hgb urine dipstick: NEGATIVE
Ketones, ur: NEGATIVE mg/dL
Nitrite: NEGATIVE
Protein, ur: NEGATIVE mg/dL
Specific Gravity, Urine: 1.01 (ref 1.005–1.030)
pH: 5 (ref 5.0–8.0)

## 2024-11-19 LAB — COMPREHENSIVE METABOLIC PANEL WITH GFR
ALT: 61 U/L — ABNORMAL HIGH (ref 0–44)
AST: 67 U/L — ABNORMAL HIGH (ref 15–41)
Albumin: 4.2 g/dL (ref 3.5–5.0)
Alkaline Phosphatase: 84 U/L (ref 38–126)
Anion gap: 11 (ref 5–15)
BUN: 13 mg/dL (ref 6–20)
CO2: 26 mmol/L (ref 22–32)
Calcium: 9.7 mg/dL (ref 8.9–10.3)
Chloride: 100 mmol/L (ref 98–111)
Creatinine, Ser: 0.9 mg/dL (ref 0.44–1.00)
GFR, Estimated: >60 mL/min
Glucose, Bld: 116 mg/dL — ABNORMAL HIGH (ref 70–99)
Potassium: 4.1 mmol/L (ref 3.5–5.1)
Sodium: 137 mmol/L (ref 135–145)
Total Bilirubin: 0.4 mg/dL (ref 0.0–1.2)
Total Protein: 7.1 g/dL (ref 6.5–8.1)

## 2024-11-19 LAB — CBC WITH DIFFERENTIAL/PLATELET
Abs Immature Granulocytes: 0.02 K/uL (ref 0.00–0.07)
Basophils Absolute: 0 K/uL (ref 0.0–0.1)
Basophils Relative: 0 %
Eosinophils Absolute: 0.1 K/uL (ref 0.0–0.5)
Eosinophils Relative: 2 %
HCT: 40.8 % (ref 36.0–46.0)
Hemoglobin: 14 g/dL (ref 12.0–15.0)
Immature Granulocytes: 0 %
Lymphocytes Relative: 28 %
Lymphs Abs: 1.6 K/uL (ref 0.7–4.0)
MCH: 30.8 pg (ref 26.0–34.0)
MCHC: 34.3 g/dL (ref 30.0–36.0)
MCV: 89.9 fL (ref 80.0–100.0)
Monocytes Absolute: 0.4 K/uL (ref 0.1–1.0)
Monocytes Relative: 7 %
Neutro Abs: 3.5 K/uL (ref 1.7–7.7)
Neutrophils Relative %: 63 %
Platelets: 332 K/uL (ref 150–400)
RBC: 4.54 MIL/uL (ref 3.87–5.11)
RDW: 11.8 % (ref 11.5–15.5)
WBC: 5.7 K/uL (ref 4.0–10.5)
nRBC: 0 % (ref 0.0–0.2)

## 2024-11-19 LAB — LIPASE, BLOOD: Lipase: 213 U/L — ABNORMAL HIGH (ref 11–51)

## 2024-11-19 NOTE — ED Triage Notes (Signed)
 C/O left flank pain that radiates to neck that started December 7th. C/O nausea/vomiting/SHOB/dizziness. Denies urinary issues. Both feet and hands swelling.

## 2024-11-19 NOTE — ED Provider Triage Note (Signed)
 Emergency Medicine Provider Triage Evaluation Note  Jennifer Hutchinson , a 54 y.o. female  was evaluated in triage.  Pt complains of abdominal pain, flank pain that has been present since December 7 and has been worsening.  Patient states that she was seen at multiple hospitals and told she might of had a kidney stone, but it was unclear.  Patient also states she had an episode of LOC yesterday and has been feeling short of breath since yesterday.  She states that she did go to [facility] a few days ago and was told that she had colitis and has been taking the antibiotics as prescribed.  However, patient states the pain has been getting worse and she is unable to eat, drink and has been feeling nauseous since.  Patient states the pain is unbearable.  Review of Systems  Positive: Abdominal pain, nausea, lightheadedness, dizziness, recent LOC today Negative:   Physical Exam  There were no vitals taken for this visit. Gen:   Awake, no distress   Resp:  Tachypneic MSK:   Moves extremities without difficulty  Other:  Abdomen soft and relatively nontender.  No CVA tenderness  Medical Decision Making  Medically screening exam initiated at 2:55 PM.  Appropriate orders placed.  Danea Quizon was informed that the remainder of the evaluation will be completed by another provider, this initial triage assessment does not replace that evaluation, and the importance of remaining in the ED until their evaluation is complete.  Labs, UA ordered   Torrence Marry RAMAN, PA-C 11/19/24 1500

## 2024-11-20 ENCOUNTER — Emergency Department (HOSPITAL_COMMUNITY)

## 2024-11-20 DIAGNOSIS — R1030 Lower abdominal pain, unspecified: Secondary | ICD-10-CM | POA: Diagnosis not present

## 2024-11-20 DIAGNOSIS — K5900 Constipation, unspecified: Secondary | ICD-10-CM

## 2024-11-20 DIAGNOSIS — R7401 Elevation of levels of liver transaminase levels: Secondary | ICD-10-CM

## 2024-11-20 DIAGNOSIS — R748 Abnormal levels of other serum enzymes: Secondary | ICD-10-CM | POA: Diagnosis not present

## 2024-11-20 DIAGNOSIS — R918 Other nonspecific abnormal finding of lung field: Secondary | ICD-10-CM

## 2024-11-20 DIAGNOSIS — F319 Bipolar disorder, unspecified: Secondary | ICD-10-CM | POA: Diagnosis not present

## 2024-11-20 DIAGNOSIS — K219 Gastro-esophageal reflux disease without esophagitis: Secondary | ICD-10-CM

## 2024-11-20 DIAGNOSIS — N200 Calculus of kidney: Secondary | ICD-10-CM

## 2024-11-20 DIAGNOSIS — R109 Unspecified abdominal pain: Secondary | ICD-10-CM | POA: Diagnosis present

## 2024-11-20 MED ORDER — TAMSULOSIN HCL 0.4 MG PO CAPS
0.4000 mg | ORAL_CAPSULE | Freq: Every day | ORAL | Status: DC
  Administered 2024-11-20 – 2024-11-21 (×2): 0.4 mg via ORAL
  Filled 2024-11-20 (×3): qty 1

## 2024-11-20 MED ORDER — FENTANYL CITRATE (PF) 50 MCG/ML IJ SOSY
50.0000 ug | PREFILLED_SYRINGE | Freq: Once | INTRAMUSCULAR | Status: AC
  Administered 2024-11-20: 50 ug via INTRAVENOUS
  Filled 2024-11-20: qty 1

## 2024-11-20 MED ORDER — SODIUM CHLORIDE 0.9 % IV SOLN: INTRAVENOUS | Status: DC

## 2024-11-20 MED ORDER — ACETAMINOPHEN 325 MG PO TABS: 650.0000 mg | ORAL_TABLET | Freq: Four times a day (QID) | ORAL | Status: DC | PRN

## 2024-11-20 MED ORDER — PANTOPRAZOLE SODIUM 40 MG IV SOLR
40.0000 mg | Freq: Two times a day (BID) | INTRAVENOUS | Status: DC
  Administered 2024-11-20 – 2024-11-21 (×3): 40 mg via INTRAVENOUS
  Filled 2024-11-20 (×3): qty 10

## 2024-11-20 MED ORDER — FENTANYL CITRATE (PF) 50 MCG/ML IJ SOSY: 50.0000 ug | PREFILLED_SYRINGE | INTRAMUSCULAR | Status: DC | PRN

## 2024-11-20 MED ORDER — ACETAMINOPHEN 650 MG RE SUPP: 650.0000 mg | Freq: Four times a day (QID) | RECTAL | Status: DC | PRN

## 2024-11-20 MED ORDER — ALBUTEROL SULFATE (2.5 MG/3ML) 0.083% IN NEBU: 2.5000 mg | INHALATION_SOLUTION | Freq: Four times a day (QID) | RESPIRATORY_TRACT | Status: DC | PRN

## 2024-11-20 MED ORDER — POLYETHYLENE GLYCOL 3350 17 GM/SCOOP PO POWD
119.0000 g | Freq: Once | ORAL | Status: AC
  Administered 2024-11-20: 119 g via ORAL
  Filled 2024-11-20: qty 119

## 2024-11-20 MED ORDER — MORPHINE SULFATE (PF) 4 MG/ML IV SOLN
4.0000 mg | Freq: Once | INTRAVENOUS | Status: AC
  Administered 2024-11-20: 4 mg via INTRAVENOUS
  Filled 2024-11-20: qty 1

## 2024-11-20 MED ORDER — IOHEXOL 350 MG/ML SOLN
75.0000 mL | Freq: Once | INTRAVENOUS | Status: AC | PRN
  Administered 2024-11-20: 75 mL via INTRAVENOUS

## 2024-11-20 MED ORDER — OXYCODONE HCL 5 MG PO TABS: 5.0000 mg | ORAL_TABLET | Freq: Four times a day (QID) | ORAL | Status: DC | PRN

## 2024-11-20 MED ORDER — LACTATED RINGERS IV BOLUS
1000.0000 mL | Freq: Once | INTRAVENOUS | Status: AC
  Administered 2024-11-20: 1000 mL via INTRAVENOUS

## 2024-11-20 MED ORDER — ONDANSETRON HCL 4 MG/2ML IJ SOLN
4.0000 mg | Freq: Once | INTRAMUSCULAR | Status: AC
  Administered 2024-11-20: 4 mg via INTRAVENOUS
  Filled 2024-11-20: qty 2

## 2024-11-20 MED ORDER — ONDANSETRON HCL 4 MG/2ML IJ SOLN: 4.0000 mg | Freq: Four times a day (QID) | INTRAMUSCULAR | Status: DC | PRN

## 2024-11-20 MED ORDER — FENTANYL CITRATE (PF) 50 MCG/ML IJ SOSY: 25.0000 ug | PREFILLED_SYRINGE | INTRAMUSCULAR | Status: DC | PRN

## 2024-11-20 MED ORDER — ONDANSETRON HCL 4 MG PO TABS: 4.0000 mg | ORAL_TABLET | Freq: Four times a day (QID) | ORAL | Status: DC | PRN

## 2024-11-20 MED ORDER — SODIUM CHLORIDE 0.9% FLUSH
3.0000 mL | Freq: Two times a day (BID) | INTRAVENOUS | Status: DC
  Administered 2024-11-20 – 2024-11-21 (×3): 3 mL via INTRAVENOUS

## 2024-11-20 MED ORDER — PHENAZOPYRIDINE HCL 200 MG PO TABS
200.0000 mg | ORAL_TABLET | Freq: Three times a day (TID) | ORAL | Status: DC
  Administered 2024-11-20 – 2024-11-21 (×2): 200 mg via ORAL
  Filled 2024-11-20 (×3): qty 1

## 2024-11-20 MED ORDER — OXYCODONE HCL 5 MG PO TABS
5.0000 mg | ORAL_TABLET | ORAL | Status: DC | PRN
  Filled 2024-11-20: qty 1

## 2024-11-20 MED ORDER — ENOXAPARIN SODIUM 40 MG/0.4ML IJ SOSY
40.0000 mg | PREFILLED_SYRINGE | INTRAMUSCULAR | Status: DC
  Administered 2024-11-20: 40 mg via SUBCUTANEOUS
  Filled 2024-11-20: qty 0.4

## 2024-11-20 NOTE — ED Notes (Signed)
 Pts pain got worse after she ate this am. Endorses pain front through to back. 10/10 pain

## 2024-11-20 NOTE — Consult Note (Addendum)
 "   Consultation  Referring Provider: ER MD/[facility] [place] Primary Care Physician:  Will Rosella, MD Primary Gastroenterologist: [place]  Reason for Consultation: Abdominal pain, elevated LFTs  HPI: Jennifer Hutchinson is a 54 y.o. female with history of previous cholecystectomy, hysterectomy prior ventral hernia repair, history of IBS, hypothyroidism, GERD, and bipolar disorder.  She presented to the emergency room here stating that she has been having persistent abdominal pain since Saturday, 11/15/2024.  On conversation with the patient she says she is primarily feeling the pain in her very low abdomen and here in her very low back.  She says the back pain feels like back labor at times. She had been somewhat frustrated as she had been to 2 other emergency rooms over the past few weeks, and came here because she did not feel comfortable with her diagnoses. She had initially been to the emergency room in [place]  on 10/25/2024.  Had a couple of falls recently and had wanted to be evaluated for that.  At that time she was told that on imaging she had any stones she was started on Flomax.  She had had a urine culture done and that came back positive for E. coli and she was subsequently started on Bactrim which she completed x 10 days. She had also been seen in the emergency room at [facility] after that and had CT imaging and was told that she had colitis and not kidney stones.  She was then given a course of Cipro and Flagyl and Bentyl. She did not feel that those medications were helping her.  Continues on Flomax at This Point and Has Been Taking Some Prescription Pain Medication.  She Does Admit to Being Constipated Which Is Unusual for Her, Having Very Hard Stools Small-Volume.  She Has Not Noted Any Melena or Hematochezia. CT Imaging abdomen and pelvis with contrast shows a 2 mm peripheral nodule in the right middle lobe, and a 2 mm nodule in the left lower lobe, and a 3 to 4 mm  nodule laterally in the left lower lobe, unremarkable liver, status postcholecystectomy no ductal dilation, simple exophytic cyst arising from the left kidney, and bilateral nonobstructing nephrolithiasis.  No evidence of any bowel wall thickening or bowel abnormality.  Labs 11/19/2024-WBC 5.7/hemoglobin 14/hematocrit 40.8/platelets 332 Lipase was 213 LFTs showed T. bili of 0.4/alk phos 84/AST 67/ALT 61 BUN 13/creatinine 0.9  UA today small leukocytes, 0-5 WBC 0-5 RBCs  We were also asked to address the elevated LFTs, as there was concern with her pain regarding potential choledocholithiasis.   Past Medical History:  Diagnosis Date   Bipolar 1 disorder (HCC)    GERD (gastroesophageal reflux disease)    Hypothyroidism    IBS (irritable bowel syndrome)    Insomnia     Past Surgical History:  Procedure Laterality Date   birthmark removal Right    Right arm   CARPAL TUNNEL RELEASE     CESAREAN SECTION     COLONOSCOPY  08/21/2013   Small internal hemorrhoids (the most likely etiology of bright red blood per rectum). Otherwise normal colonoscopy to terminal ileum   LAPAROSCOPIC CHOLECYSTECTOMY  10/27/2008   TUBAL LIGATION  04/20/2008   VENTRAL HERNIA REPAIR     08/25/2007, 08/05/2009 Dr Gerard. Incarcerated ventral hernia    Prior to Admission medications  Medication Sig Start Date End Date Taking? Authorizing Provider  ciprofloxacin (CIPRO) 500 MG tablet Take 500 mg by mouth 2 (two) times daily. 11/16/24  Yes [provider]  dicyclomine (BENTYL) 20 MG tablet Take 20 mg by mouth every 6 (six) hours. 11/16/24  Yes [provider]  HYDROcodone -acetaminophen  (NORCO) 5-325 MG per tablet Take 1 tablet by mouth every 6 (six) hours as needed for moderate pain. 05/11/15  Yes Neese, Hope M, NP  ibuprofen  (ADVIL ,MOTRIN ) 800 MG tablet Take 1 tablet (800 mg total) by mouth 3 (three) times daily. 05/11/15  Yes Neese, Hope M, NP  metroNIDAZOLE (FLAGYL) 500 MG tablet Take 500 mg  by mouth 2 (two) times daily. 11/16/24  Yes [provider]  ondansetron  (ZOFRAN ) 8 MG tablet Take 8 mg by mouth every 8 (eight) hours as needed. 10/29/24  Yes [provider]  ondansetron  (ZOFRAN -ODT) 4 MG disintegrating tablet Take 4 mg by mouth every 8 (eight) hours as needed.   Yes [provider]  pregabalin (LYRICA) 75 MG capsule Take 75 mg by mouth. 09/29/24 11/28/24 Yes [provider]  Vitamin D, Ergocalciferol, (DRISDOL) 1.25 MG (50000 UNIT) CAPS capsule Take 50,000 Units by mouth once a week. 08/26/24 08/26/25 Yes [provider]  sulfamethoxazole-trimethoprim (BACTRIM DS) 800-160 MG tablet Take 1 tablet by mouth 2 (two) times daily. Patient not taking: Reported on 11/20/2024 10/29/24   [provider]  tamsulosin (FLOMAX) 0.4 MG CAPS capsule Take 0.4 mg by mouth daily. Patient not taking: Reported on 11/20/2024 10/29/24   [provider]    Current Facility-Administered Medications  Medication Dose Route Frequency Provider Last Rate Last Admin   0.9 %  sodium chloride  infusion   Intravenous Continuous Claudene Maximino LABOR, MD 75 mL/hr at 11/20/24 1548 New Bag at 11/20/24 1548   acetaminophen  (TYLENOL ) tablet 650 mg  650 mg Oral Q6H PRN Smith, Rondell A, MD       Or   acetaminophen  (TYLENOL ) suppository 650 mg  650 mg Rectal Q6H PRN Smith, Rondell A, MD       albuterol (PROVENTIL) (2.5 MG/3ML) 0.083% nebulizer solution 2.5 mg  2.5 mg Nebulization Q6H PRN Claudene, Rondell A, MD       enoxaparin (LOVENOX) injection 40 mg  40 mg Subcutaneous Q24H Smith, Rondell A, MD       fentaNYL (SUBLIMAZE) injection 50 mcg  50 mcg Intravenous Q2H PRN Smith, Rondell A, MD       ondansetron  (ZOFRAN ) tablet 4 mg  4 mg Oral Q6H PRN Claudene Maximino LABOR, MD       Or   ondansetron  (ZOFRAN ) injection 4 mg  4 mg Intravenous Q6H PRN Smith, Rondell A, MD       oxyCODONE (Oxy IR/ROXICODONE) immediate release tablet 5 mg  5 mg Oral Q4H PRN Smith, Rondell A, MD        pantoprazole (PROTONIX) injection 40 mg  40 mg Intravenous Q12H Smith, Rondell A, MD       phenazopyridine (PYRIDIUM) tablet 200 mg  200 mg Oral TID WC Smith, Rondell A, MD       polyethylene glycol powder (GLYCOLAX/MIRALAX) container 119 g  119 g Oral Once Smith, Rondell A, MD       sodium chloride  flush (NS) 0.9 % injection 3 mL  3 mL Intravenous Q12H Smith, Rondell A, MD   3 mL at 11/20/24 1501   tamsulosin (FLOMAX) capsule 0.4 mg  0.4 mg Oral Daily Claudene Maximino A, MD        Allergies as of 11/19/2024 - Review Complete 11/19/2024  Allergen Reaction Noted   Lamictal [lamotrigine]  03/06/2015    History reviewed. No pertinent family history.  Social History   Socioeconomic History   Marital status: Single    Spouse name: Not on file   Number of children: Not on file   Years of education: Not on file   Highest education level: Not on file  Occupational History   Not on file  Tobacco Use   Smoking status: Never   Smokeless tobacco: Not on file  Substance and Sexual Activity   Alcohol use: Yes   Drug use: Not on file   Sexual activity: Not on file  Other Topics Concern   Not on file  Social History Narrative   Not on file   Social Drivers of Health   Tobacco Use: Unknown (11/19/2024)   Patient History    Smoking Tobacco Use: Never    Smokeless Tobacco Use: Unknown    Passive Exposure: Not on file  Financial Resource Strain: Not on file  Food Insecurity: Food Insecurity Present (11/20/2024)   Epic    Worried About Programme Researcher, Broadcasting/film/video in the Last Year: Sometimes true    Ran Out of Food in the Last Year: Sometimes true  Transportation Needs: No Transportation Needs (11/20/2024)   Epic    Lack of Transportation (Medical): No    Lack of Transportation (Non-Medical): No  Physical Activity: Not on file  Stress: Not on file  Social Connections: Not on file  Intimate Partner Violence: Not At Risk (11/20/2024)   Epic    Fear of Current or Ex-Partner: No    Emotionally  Abused: No    Physically Abused: No    Sexually Abused: No  Depression (PHQ2-9): Not on file  Alcohol Screen: Not on file  Housing: High Risk (11/20/2024)   Epic    Unable to Pay for Housing in the Last Year: Yes    Number of Times Moved in the Last Year: 0    Homeless in the Last Year: No  Utilities: Patient Declined (11/20/2024)   Epic    Threatened with loss of utilities: Patient declined  Health Literacy: Not on file    Review of Systems: Pertinent positive and negative review of systems were noted in the above HPI section.  All other review of systems was otherwise negative.   Physical Exam: Vital signs in last 24 hours: Temp:  [97.5 F (36.4 C)-98.5 F (36.9 C)] 98.3 F (36.8 C) (01/02 1523) Pulse Rate:  [57-74] 66 (01/02 1523) Resp:  [16-22] 16 (01/02 1157) BP: (108-142)/(71-91) 142/91 (01/02 1523) SpO2:  [96 %-100 %] 100 % (01/02 1523)   General:   Alert,  Well-developed, well-nourished, older white female pleasant and cooperative in NAD Head:  Normocephalic and atraumatic. Eyes:  Sclera clear, no icterus.   Conjunctiva pink. Ears:  Normal auditory acuity. Nose:  No deformity, discharge,  or lesions. Mouth:  No deformity or lesions.   Neck:  Supple; no masses or thyromegaly. Lungs:  Clear throughout to auscultation.   No wheezes, crackles, or rhonchi . Heart:  Regular rate and rhythm; no murmurs, clicks, rubs,  or gallops. Abdomen: There is some mild tenderness in the suprapubic area, no guarding or rebound, no palpable mass or hepatosplenomegaly, bowel sounds are present Rectal: Not done Msk:  Symmetrical without gross deformities. . Pulses:  Normal pulses noted. Extremities:  Without clubbing or edema. Neurologic:  Alert and  oriented x4;  grossly normal neurologically. Skin:  Intact without significant lesions or rashes.. Psych:  Alert and cooperative. Normal mood and affect.  Intake/Output from previous day: No intake/output data recorded.  Intake/Output this  shift: Total I/O In: 1000.2 [IV Piggyback:1000.2] Out: -   Lab Results: Recent Labs    11/19/24 1529  WBC 5.7  HGB 14.0  HCT 40.8  PLT 332   BMET Recent Labs    11/19/24 1529  NA 137  K 4.1  CL 100  CO2 26  GLUCOSE 116*  BUN 13  CREATININE 0.90  CALCIUM 9.7   LFT Recent Labs    11/19/24 1529  PROT 7.1  ALBUMIN 4.2  AST 67*  ALT 61*  ALKPHOS 84  BILITOT 0.4   PT/INR No results for input(s): LABPROT, INR in the last 72 hours. Hepatitis Panel No results for input(s): HEPBSAG, HCVAB, HEPAIGM, HEPBIGM in the last 72 hours.   IMPRESSION:  #48 54 year old white female who has been having lower abdominal pain and low back pain over the past 3 weeks Initial ER visit [facility]  with diagnosis of kidney stones, started on Flomax, also diagnosed at that time with an E. coli UTI and given a 10-day course of Bactrim.  Apparently with persistent symptoms had gone to the emergency room at [facility] several days later.  Had repeat CT imaging and was told that she may have some colitis and was given an empiric course of Cipro and Flagyl and Bentyl.  He has completed the course of Bactrim, did not feel that the Bentyl was helping her.  Not been having any dysuria until over the past 24 hours.  Came to the ER here as she was still hurting and uncomfortable with her diagnoses.  She does have a urology appointment pending in [place].  CT imaging here does not show any evidence of colitis, no ductal dilation, she has nephrolithiasis but no ureterolithiasis appreciated She does have a lot of retained stool throughout her colon  Suspect that she did have a UTI, may still have some cystitis type symptoms but does not have an active UTI currently.  Unclear whether or not she may have passed a small kidney stone but no current ureterolithiasis  #2 mild transaminitis-this is not consistent with choledocholithiasis, AST 65/ALT 61  Likely drug-induced  mild transaminitis secondary to Bactrim  Plan; patient is to be admitted to observation Suggest bowel purge with a MiraLAX Gatorade prep or Nulytely Advised patient to minimize use of narcotics moving forward due to obstipation Stop Bentyl GI will not plan to follow, available if needed, hopefully she can be discharged tomorrow     Amy EsterwoodPA-C  11/20/2024, 4:47 PM  I have taken an interval history, thoroughly reviewed the chart and examined the patient. I agree with the Advanced Practitioner's note, impression and recommendations, and have recorded additional findings, impressions and recommendations below. I performed a substantive portion of this encounter (>50% time spent), including a complete performance of the medical decision making.  I also personally reviewed the CT abdomen and pelvis images from today and from the recent ED visit and [place], [place].  I do not think there was any true colitis on the [place] CT  My additional thoughts are as follows:  Overall clinical picture favors this patient having a combination of chronic lumbar sacral pain and what sounds like a recent urinary tract infection that is now cleared.  I suspect she then got constipated from pain medicine and dicyclomine (large amount of retained fecal material on today's CT scan). Mild nonspecific transaminitis (which is new from some normal LFTs from July of this year found in Care Everywhere) seem most likely related  to recent antibiotic use, particularly Bactrim.  Recommendations as above _________________  This consultation required a moderate degree of medical decision making due to the nature and complexity of the acute condition(s) being evaluated as well as the patient's medical comorbidities.  Victory LITTIE Brand III [phone]     "

## 2024-11-20 NOTE — ED Provider Notes (Signed)
 " [place] EMERGENCY DEPARTMENT AT [facility] Provider Note   CSN: [id number] Arrival date & time: 11/19/24  1451     Patient presents with: Abdominal Pain   Roseline Bixler is a 54 y.o. female.   Patient is a 54 year old female with past medical history of bipolar disorder, GERD, prior cholecystectomy and hysterectomy presenting to the emergency department with abdominal pain.  Patient states that her pain started on Saturday.  She states that sometimes it is in her lower abdomen and sometimes it is her upper abdomen and she can also feel the pain diffusely.  She states that it radiates into her low back.  She states that it feels like contractions.  She endorses associated nausea, denies any vomiting.  She states that she has felt feverish but has not had a measured fever.  She states that she has been constipated.  She states that she was seen at an outside ED on Saturday as well as Monday.  She states that she was initially diagnosed with a kidney stone and started on Flomax but then was told on Monday that she did not have a stone and that she had colitis.  She states that the Flomax was stopped and she was started on antibiotics.  She states that despite taking the antibiotics that she has had no improvement of her symptoms.  She states that she has also been taking Bentyl and Norco for pain at home.  The history is provided by the patient and the spouse.  Abdominal Pain      Prior to Admission medications  Medication Sig Start Date End Date Taking? Authorizing Provider  HYDROcodone -acetaminophen  (NORCO) 5-325 MG per tablet Take 1 tablet by mouth every 6 (six) hours as needed for moderate pain. 05/11/15   Jamelle Lorrayne HERO, NP  ibuprofen  (ADVIL ,MOTRIN ) 800 MG tablet Take 1 tablet (800 mg total) by mouth 3 (three) times daily. 05/11/15   Jamelle Lorrayne HERO, NP    Allergies: Lamictal [lamotrigine]    Review of Systems  Gastrointestinal:  Positive for abdominal pain.    Updated  Vital Signs BP 118/85 (BP Location: Left Arm)   Pulse 74   Temp 98.3 F (36.8 C) (Temporal)   Resp 16   Ht 5' 4 (1.626 m)   Wt 79.4 kg   SpO2 96%   BMI 30.04 kg/m   Physical Exam Vitals and nursing note reviewed.  Constitutional:      General: She is not in acute distress.    Appearance: She is well-developed.  HENT:     Head: Normocephalic.     Mouth/Throat:     Mouth: Mucous membranes are moist.  Eyes:     Extraocular Movements: Extraocular movements intact.  Cardiovascular:     Rate and Rhythm: Normal rate and regular rhythm.     Heart sounds: Normal heart sounds.  Pulmonary:     Effort: Pulmonary effort is normal.     Breath sounds: Normal breath sounds.  Abdominal:     General: Abdomen is flat.     Palpations: Abdomen is soft.     Tenderness: There is generalized abdominal tenderness. There is guarding. There is no right CVA tenderness, left CVA tenderness or rebound.  Skin:    General: Skin is warm and dry.  Neurological:     General: No focal deficit present.     Mental Status: She is alert and oriented to person, place, and time.  Psychiatric:        Mood  and Affect: Mood normal.        Behavior: Behavior normal.     (all labs ordered are listed, but only abnormal results are displayed) Labs Reviewed  COMPREHENSIVE METABOLIC PANEL WITH GFR - Abnormal; Notable for the following components:      Result Value   Glucose, Bld 116 (*)    AST 67 (*)    ALT 61 (*)    All other components within normal limits  LIPASE, BLOOD - Abnormal; Notable for the following components:   Lipase 213 (*)    All other components within normal limits  URINALYSIS, ROUTINE W REFLEX MICROSCOPIC - Abnormal; Notable for the following components:   Leukocytes,Ua SMALL (*)    All other components within normal limits  CBC WITH DIFFERENTIAL/PLATELET    EKG: None  Radiology: CT ABDOMEN PELVIS W CONTRAST Result Date: 11/20/2024 EXAM: CT ABDOMEN AND PELVIS WITH CONTRAST  11/20/2024 03:43:41 AM TECHNIQUE: CT of the abdomen and pelvis was performed with the administration of intravenous contrast. 75 mL of iohexol  (OMNIPAQUE ) 350 MG/ML injection was administered. Multiplanar reformatted images are provided for review. Automated exposure control, iterative reconstruction, and/or weight-based adjustment of the mA/kV was utilized to reduce the radiation dose to as low as reasonably achievable. COMPARISON: CT of the abdomen and pelvis dated 11/14/2024. CLINICAL HISTORY: Abdominal pain, acute, nonlocalized. FINDINGS: LOWER CHEST: Mild dependent atelectasis. There is a 2 mm peripheral nodule again demonstrated laterally within the right middle lobe on image 1 of series 5. There is also a 2 mm nodule present anterolaterally within the base of the left lower lobe on image 3. There is a 3 to 4 mm nodule present laterally within the left lower lobe on image 7. LIVER: The liver is unremarkable. GALLBLADDER AND BILE DUCTS: The patient is status post cholecystectomy. No biliary ductal dilatation. SPLEEN: No acute abnormality. PANCREAS: No acute abnormality. ADRENAL GLANDS: No acute abnormality. KIDNEYS, URETERS AND BLADDER: There is a simple exophytic cyst arising medially from the superior pole of the left kidney. There are also small simple cysts present within the right kidney. There is a 2 to 3 mm nonobstructive calculus also present within the lower pole of the right kidney. There are also a couple of 2 to 3 mm nonobstructive calculi within the lower pole of the left kidney. There is no evidence of obstructive uropathy. No hydronephrosis. No perinephric or periureteral stranding. Urinary bladder is unremarkable. Per consensus, no follow-up is needed for simple Bosniak type 1 and 2 renal cysts, unless the patient has a malignancy history or risk factors. GI AND BOWEL: Stomach demonstrates no acute abnormality. There is no bowel obstruction. PERITONEUM AND RETROPERITONEUM: No ascites. No free  air. VASCULATURE: Aorta is normal in caliber. LYMPH NODES: No lymphadenopathy. REPRODUCTIVE ORGANS: The patient is status post hysterectomy and bilateral salpingo-oophorectomy. BONES AND SOFT TISSUES: Mild levoscoliosis of the thoracolumbar spine. No acute osseous abnormality. No focal soft tissue abnormality. IMPRESSION: 1. No acute findings in the abdomen or pelvis. 2. Multiple tiny pulmonary nodules measuring up to 3-4 mm. No routine follow-up imaging is recommended per Fleischner Society Guidelines (in a low-risk patient). 3. Bilateral nonobstructing nephrolithiasis. Simple bilateral renal cysts. Electronically signed by: Evalene Coho MD 11/20/2024 04:16 AM EST RP Workstation: [id number]     Procedures   Medications Ordered in the ED  iohexol  (OMNIPAQUE ) 350 MG/ML injection 75 mL (75 mLs Intravenous Contrast Given 11/20/24 0344)  fentaNYL (SUBLIMAZE) injection 50 mcg (50 mcg Intravenous Given 11/20/24 0623)  morphine (PF) 4  MG/ML injection 4 mg (4 mg Intravenous Given 11/20/24 1214)  ondansetron  (ZOFRAN ) injection 4 mg (4 mg Intravenous Given 11/20/24 1214)  lactated ringers bolus 1,000 mL (0 mLs Intravenous Stopped 11/20/24 1320)    Clinical Course as of 11/20/24 1411  Fri Nov 20, 2024  1322 I spoke with Greig Corti PA with GI who is coming to see the patient. States does not need emergent MRCP with minimally elevated LFTs but would recommend hospitalist admission for pain control and will give formal recommendations after their evaluation.  [VK]    Clinical Course User Index [VK] Kingsley, Min Tunnell K, DO                                 Medical Decision Making This patient presents to the ED with chief complaint(s) of abdominal pain with pertinent past medical history of bipolar disorder, GERD, prior hysterectomy and cholecystectomy which further complicates the presenting complaint. The complaint involves an extensive differential diagnosis and also carries with it a high risk of  complications and morbidity.    The differential diagnosis includes gastroenteritis, gastritis, GERD, pancreatitis, hepatitis, nephrolithiasis, pyelonephritis, UTI, colitis, other intra-abdominal infection  Additional history obtained: Additional history obtained from spouse Records reviewed Care Everywhere/External Records  ED Course and Reassessment: On patient's arrival she is hemodynamically stable in no acute distress.  She was initially evaluated in triage and had labs, urine and CT abdomen and pelvis performed.  Patient's labs does show a mild transaminitis as well as elevated lipase, labs are otherwise within normal range.  CT abdomen and pelvis did not show any evidence of pancreatitis and did show an normal appearing size of her biliary duct.  She did have stones in her kidneys but no obstructing stones and no other acute abnormalities to explain her symptoms.  With elevated lipase and LFTs I am concerned for possible retained biliary stone and will plan to consult GI.  Patient was given pain and nausea control and will be closely reassessed.  Independent labs interpretation:  The following labs were independently interpreted: transaminitis, elevated lipase  Independent visualization of imaging: - I independently visualized the following imaging with scope of interpretation limited to determining acute life threatening conditions related to emergency care: CTAP, which revealed no acute abnormality to explain symptoms  Consultation: - Consulted or discussed management/test interpretation w/ external professional: GI, hospitalist  Consideration for admission or further workup: patient requires admission for pain control  Social Determinants of health: N/A    Risk Prescription drug management. Decision regarding hospitalization.       Final diagnoses:  Generalized abdominal pain  Transaminitis  Elevated lipase    ED Discharge Orders     None          Ellouise Richerd POUR, DO 11/20/24 1411  "

## 2024-11-20 NOTE — H&P (Addendum)
 " History and Physical    Patient: Jennifer Hutchinson FMW:[id number] DOB: 08-Oct-1971 DOA: 11/19/2024 DOS: the patient was seen and examined on 11/20/2024 PCP: Will Rosella, MD  Patient coming from: Home  Chief Complaint:  Chief Complaint  Patient presents with   Abdominal Pain   HPI: Jennifer Hutchinson is a 54 y.o. female with medical history significant of hypothyroidism, bipolar disorder, s/p cholecystectomy presents with abdominal pain.  She has been experiencing abdominal pain since December 7th, initially thought to be related to a fall and subsequent concussion. The pain was initially attributed to a kidney stone found during a visit to [facility], where a 3mm stone was identified. She was treated with Flomax, pain medications, and Zofran . Despite treatment, she experienced nausea and passed out a few days later, leading to a diagnosis of a UTI and treatment with Bactrim.  Review of records from 12/7 note that CT scan of the head and cervical spine were unremarkable. She did have tenderness over the lateral right lower back at that time. Urinalysis she had significant trace leukocytes and hematuria with 3+ blood. CT was completed and there was a 3 mm stone in the right proximal ureter and mild right-sided hydronephrosis. Urine culture grew out E. coli.  Sent in referral to neurology, urology, and pain management.  Following the completion of Bactrim, she experienced recurrent pain and sought care at [place], where she was diagnosed with colitis and prescribed Flagyl, ciprofloxacin, Zofran , and Bentyl. She describes the pain as sharp and contraction-like, located in the lower abdomen and back, similar to 'back labor'.  She has a history of multiple abdominal surgeries, including gallbladder removal approximately 20 years ago, hernia repair with mesh, C-section, and tubal ligation. The pain worsens after eating, as evidenced by increased pain after consuming half a bagel. She experiences  significant nausea and occasional vomiting, with episodes of spitting up clear, jelly-like material. She has had her esophagus stretched twice in the past.   In the emergency department patient was afebrile with stable vital signs.  Labs noted lipase 213, AST 67, and ALT 61.   Urinalysis noted small leukocytes with no other significant signs for infection.  CT scan of the abdomen pelvis was obtained which did reveal any acute findings and multiple tiny pulmonary nodules measuring 3 to 4 mm and bilateral nonobstructing nephrolithiasis with simple bilateral renal cyst.  Patient had been given 1 L of lactated Ringer's, Zofran , morphine, and fentanyl for pain without improvement.  TR[facility] called to admit.  [facility] was consulted to evaluate.  Review of Systems: As mentioned in the history of present illness. All other systems reviewed and are negative. Past Medical History:  Diagnosis Date   Bipolar 1 disorder (HCC)    GERD (gastroesophageal reflux disease)    Hypothyroidism    IBS (irritable bowel syndrome)    Insomnia    Past Surgical History:  Procedure Laterality Date   birthmark removal Right    Right arm   CARPAL TUNNEL RELEASE     CESAREAN SECTION     COLONOSCOPY  08/21/2013   Small internal hemorrhoids (the most likely etiology of bright red blood per rectum). Otherwise normal colonoscopy to terminal ileum   LAPAROSCOPIC CHOLECYSTECTOMY  10/27/2008   TUBAL LIGATION  04/20/2008   VENTRAL HERNIA REPAIR     08/25/2007, 08/05/2009 Dr Gerard. Incarcerated ventral hernia   Social History:  reports that she has never smoked. She does not have any smokeless tobacco history on file. She reports current alcohol  use. No history on file for drug use.  Allergies[1]  History reviewed. No pertinent family history.  Prior to Admission medications  Medication Sig Start Date End Date Taking? Authorizing Provider  ciprofloxacin (CIPRO) 500 MG tablet Take 500 mg by mouth 2 (two) times daily.  11/16/24  Yes [provider]  dicyclomine (BENTYL) 20 MG tablet Take 20 mg by mouth every 6 (six) hours. 11/16/24  Yes [provider]  metroNIDAZOLE (FLAGYL) 500 MG tablet Take 500 mg by mouth 2 (two) times daily. 11/16/24  Yes [provider]  ondansetron  (ZOFRAN ) 8 MG tablet Take 8 mg by mouth every 8 (eight) hours as needed. 10/29/24  Yes [provider]  pregabalin (LYRICA) 75 MG capsule Take 75 mg by mouth. 09/29/24 11/28/24 Yes [provider]  sulfamethoxazole-trimethoprim (BACTRIM DS) 800-160 MG tablet Take 1 tablet by mouth 2 (two) times daily. 10/29/24  Yes [provider]  tamsulosin (FLOMAX) 0.4 MG CAPS capsule Take 0.4 mg by mouth daily. 10/29/24  Yes [provider]  Vitamin D, Ergocalciferol, (DRISDOL) 1.25 MG (50000 UNIT) CAPS capsule Take 50,000 Units by mouth once a week. 08/26/24 08/26/25 Yes [provider]  HYDROcodone -acetaminophen  (NORCO) 5-325 MG per tablet Take 1 tablet by mouth every 6 (six) hours as needed for moderate pain. 05/11/15   Jamelle Lorrayne HERO, NP  ibuprofen  (ADVIL ,MOTRIN ) 800 MG tablet Take 1 tablet (800 mg total) by mouth 3 (three) times daily. 05/11/15   Jamelle Lorrayne HERO, NP  ondansetron  (ZOFRAN -ODT) 4 MG disintegrating tablet Take 4 mg by mouth every 8 (eight) hours as needed.    [provider]    Physical Exam: Vitals:   11/19/24 2111 11/20/24 0309 11/20/24 0617 11/20/24 1157  BP: 108/78 125/71 119/87 118/85  Pulse: (!) 57 62 67 74  Resp: 18 20 (!) 22 16  Temp: 97.8 F (36.6 C) (!) 97.5 F (36.4 C) 98.5 F (36.9 C) 98.3 F (36.8 C)  TempSrc: Oral Oral Oral Temporal  SpO2: 100% 100% 100% 96%  Weight:      Height:          Constitutional: Middle-aged female currently in no acute distress Eyes: PERRL, lids and conjunctivae normal ENMT: Mucous membranes are moist. .Normal dentition.  Neck: normal, supple, no masses, no thyromegaly Respiratory: clear to auscultation  bilaterally, no wheezing, no crackles. Normal respiratory effort.   Cardiovascular: Regular rate and rhythm, no murmurs / rubs / gallops. No extremity edema.   Abdomen: Soft without masses appreciated.  Mostly suprapubic tenderness to palpation noted bowel sounds present in all 4 quadrants. Musculoskeletal: no clubbing / cyanosis. No joint deformity upper and lower extremities. Good ROM, no contractures. Normal muscle tone.  Skin: no rashes, lesions, ulcers. No induration Neurologic: CN 2-12 grossly intact.  Strength 5/5 in all 4.  Psychiatric: Normal judgment and insight. Alert and oriented x 3. Normal mood.   Data Reviewed:  reviewed labs, imaging, and pertinent records as documented.  Assessment and Plan:  Abdominal pain Patient presents with complaints of generalized abdominal pain that she reports most severe in the lower abdomen suprapubic with radiation to her back.  Recently treated for concerns for nephrolithiasis with UTI as well as colitis.  Urinalysis without significant signs for infection.  Labs elevated lipase records note previous cultures grew out E. coli for which patient completed antibiotic course.  CT noted bilateral nephrolithiasis without any obstructive findings and simple cyst. Question possibility of constipation versus nephrolithiasis associated pain versus other. - Admit to a  telemetry bed - On clear liquid diet and advance as tolerated - Normal saline IV fluids at 75 mL/h - Trial of Pyridium - Oxycodone/Fentanyl IV as needed for moderate to severe pain respectively - Holding antibiotics as no clear source of infection noted at this time. - GI consulted, but did not recommend any further evaluation with MRCP at this time as symptoms did not appear to be  Elevated lipase Acute.  Lipase elevated at 213.  No clear cause for symptoms noted on imaging. Currently she does not meet criteria for pancreatitis  Nephrolithiasis Patient noted to have bilateral metoprolol  lysis with nonobstructing stones. - Continue Flomax to encourage passage of stones  Transaminitis Acute.  AST 67 ALT 61 which is just mildly elevated.  No acute cause appreciated for findings.  LFTs possibly elevated due to recent use of Bactrim - Continue to monitor  Pulmonary nodules Incidental finding noted multiple tiny pulmonary nodules measuring 3 to 4 mm. - Follow-up with primary care provider for outpatient repeat imaging if deemed medically appropriate  Bipolar disorder - Resume home medication regimen once verified  GERD Patient reports prior history of esophageal stricture requiring dilation in the past.  She does not appear to be on any antacid medicine. - Start Protonix   DVT prophylaxis: Lovenox Advance Care Planning:   Code Status: Full Code   Consults: Gastroenterology  Family Communication: None  Severity of Illness: The appropriate patient status for this patient is OBSERVATION. Observation status is judged to be reasonable and necessary in order to provide the required intensity of service to ensure the patient's safety. The patient's presenting symptoms, physical exam findings, and initial radiographic and laboratory data in the context of their medical condition is felt to place them at decreased risk for further clinical deterioration. Furthermore, it is anticipated that the patient will be medically stable for discharge from the hospital within 2 midnights of admission.   Author: Maximino DELENA Sharps, MD 11/20/2024 2:19 PM  For on call review [email or web address].      [1]  Allergies Allergen Reactions   Lamictal [Lamotrigine]    "

## 2024-11-20 NOTE — ED Notes (Signed)
 Pt provided recliner to sit in due to wait time.

## 2024-11-21 DIAGNOSIS — R1030 Lower abdominal pain, unspecified: Secondary | ICD-10-CM | POA: Diagnosis not present

## 2024-11-21 LAB — COMPREHENSIVE METABOLIC PANEL WITH GFR
ALT: 57 U/L — ABNORMAL HIGH (ref 0–44)
AST: 51 U/L — ABNORMAL HIGH (ref 15–41)
Albumin: 4 g/dL (ref 3.5–5.0)
Alkaline Phosphatase: 74 U/L (ref 38–126)
Anion gap: 9 (ref 5–15)
BUN: 8 mg/dL (ref 6–20)
CO2: 27 mmol/L (ref 22–32)
Calcium: 9.3 mg/dL (ref 8.9–10.3)
Chloride: 106 mmol/L (ref 98–111)
Creatinine, Ser: 0.79 mg/dL (ref 0.44–1.00)
GFR, Estimated: >60 mL/min
Glucose, Bld: 97 mg/dL (ref 70–99)
Potassium: 3.9 mmol/L (ref 3.5–5.1)
Sodium: 142 mmol/L (ref 135–145)
Total Bilirubin: 0.5 mg/dL (ref 0.0–1.2)
Total Protein: 6.5 g/dL (ref 6.5–8.1)

## 2024-11-21 LAB — CBC
HCT: 38.5 % (ref 36.0–46.0)
Hemoglobin: 12.9 g/dL (ref 12.0–15.0)
MCH: 29.9 pg (ref 26.0–34.0)
MCHC: 33.5 g/dL (ref 30.0–36.0)
MCV: 89.3 fL (ref 80.0–100.0)
Platelets: 307 K/uL (ref 150–400)
RBC: 4.31 MIL/uL (ref 3.87–5.11)
RDW: 12 % (ref 11.5–15.5)
WBC: 5.3 K/uL (ref 4.0–10.5)
nRBC: 0 % (ref 0.0–0.2)

## 2024-11-21 LAB — HIV ANTIBODY (ROUTINE TESTING W REFLEX): HIV Screen 4th Generation wRfx: NONREACTIVE

## 2024-11-21 MED ORDER — POLYETHYLENE GLYCOL 3350 17 G PO PACK: 17.0000 g | PACK | Freq: Every day | ORAL | 0 refills | Status: AC | PRN

## 2024-11-21 NOTE — Discharge Summary (Signed)
 " Physician Discharge Summary   Patient: Jennifer Hutchinson MRN: [id number] DOB: 03-23-1971  Admit date:     11/19/2024  Discharge date: 11/21/2024  Discharge Physician: Deliliah Room   PCP: Will Rosella, MD   Recommendations at discharge:    F/u with your PCP in one week. Continue taking meds as prescribed  Discharge Diagnoses: Principal Problem:   Abdominal pain Active Problems:   Elevated lipase   Nephrolithiasis   Transaminitis   Pulmonary nodules   Bipolar disorder (HCC)   GERD (gastroesophageal reflux disease)   Acute constipation  Hospital Course:   54 y.o. female with medical history significant of hypothyroidism, bipolar disorder, s/p cholecystectomy presents with abdominal pain. She has a history of multiple abdominal surgeries, including gallbladder removal approximately 20 years ago, hernia repair with mesh, C-section, and tubal ligation. The pain worsens after eating, as evidenced by increased pain after consuming half a bagel. She experiences significant nausea and occasional vomiting, with episodes of spitting up clear, jelly-like material. She has had her esophagus stretched twice in the past.   In the emergency department patient was afebrile with stable vital signs.  Labs noted lipase 213, AST 67, and ALT 61.   Urinalysis noted small leukocytes with no other significant signs for infection.  CT scan of the abdomen pelvis was obtained which did reveal any acute findings and multiple tiny pulmonary nodules measuring 3 to 4 mm and bilateral nonobstructing nephrolithiasis with simple bilateral renal cyst.  Patient had been given 1 L of lactated Ringer 's, Zofran , morphine , and fentanyl  for pain without improvement.  [facility] called to admit.  [facility] was consulted to evaluate.   Abdominal pain Patient presents with complaints of generalized abdominal pain that she reports most severe in the lower abdomen suprapubic with radiation to her back.  Recently treated for concerns for  nephrolithiasis with UTI as well as colitis.  Urinalysis without significant signs for infection.  Labs elevated lipase records note previous cultures grew out E. coli for which patient completed antibiotic course.  CT noted bilateral nephrolithiasis without any obstructive findings and simple cyst. Question possibility of constipation versus nephrolithiasis associated pain versus other. -Received IVF. - Holding antibiotics as no clear source of infection noted at this time. - GI consulted. Recommendation is to stop bentyl and use laxatives. -Patient had two Bowel movements.   Elevated lipase Acute.  Lipase elevated at 213.  No clear cause for symptoms noted on imaging. Currently she does not meet criteria for pancreatitis   Nephrolithiasis Patient noted to have bilateral metoprolol lysis with nonobstructing stones.    Transaminitis: improved on discharge. Acute.  AST 67 ALT 61 which is just mildly elevated.  No acute cause appreciated for findings.  LFTs possibly elevated due to recent use of Bactrim   Pulmonary nodules Incidental finding noted multiple tiny pulmonary nodules measuring 3 to 4 mm. - Follow-up with primary care provider for outpatient repeat imaging if deemed medically appropriate   Bipolar disorder - Continue with home meds   GERD Patient reports prior history of esophageal stricture requiring dilation in the past.    Disposition: Home. She is IADL.       Consultants: GI Procedures performed: None  Disposition: Home Diet recommendation:  Regular diet DISCHARGE MEDICATION: Allergies as of 11/21/2024       Reactions   Lamictal [lamotrigine]         Medication List     STOP taking these medications    dicyclomine 20 MG tablet Commonly known  as: BENTYL   sulfamethoxazole-trimethoprim 800-160 MG tablet Commonly known as: BACTRIM DS   tamsulosin  0.4 MG Caps capsule Commonly known as: FLOMAX        TAKE these medications    ciprofloxacin 500 MG  tablet Commonly known as: CIPRO Take 500 mg by mouth 2 (two) times daily.   HYDROcodone -acetaminophen  5-325 MG tablet Commonly known as: Norco Take 1 tablet by mouth every 6 (six) hours as needed for moderate pain.   ibuprofen  800 MG tablet Commonly known as: ADVIL  Take 1 tablet (800 mg total) by mouth 3 (three) times daily.   metroNIDAZOLE 500 MG tablet Commonly known as: FLAGYL Take 500 mg by mouth 2 (two) times daily.   ondansetron  4 MG disintegrating tablet Commonly known as: ZOFRAN -ODT Take 4 mg by mouth every 8 (eight) hours as needed.   ondansetron  8 MG tablet Commonly known as: ZOFRAN  Take 8 mg by mouth every 8 (eight) hours as needed.   polyethylene glycol 17 g packet Commonly known as: MiraLax  Take 17 g by mouth daily as needed for moderate constipation.   pregabalin 75 MG capsule Commonly known as: LYRICA Take 75 mg by mouth.   Vitamin D (Ergocalciferol) 1.25 MG (50000 UNIT) Caps capsule Commonly known as: DRISDOL Take 50,000 Units by mouth once a week.        Follow-up Information     Will Rosella, MD. Schedule an appointment as soon as possible for a visit in 1 week(s).   Specialty: Family Medicine Contact information: [address] [place] [address] [phone]                Discharge Exam: Filed Weights   11/19/24 1505  Weight: 79.4 kg   Constitutional: NAD, calm, comfortable Eyes: PERRL, lids and conjunctivae normal ENMT: Mucous membranes are moist. Posterior pharynx clear of any exudate or lesions.Normal dentition.  Neck: normal, supple, no masses, no thyromegaly Respiratory: clear to auscultation bilaterally, no wheezing, no crackles. Normal respiratory effort. No accessory muscle use.  Cardiovascular: Regular rate and rhythm, no murmurs / rubs / gallops. No extremity edema. 2+ pedal pulses. No carotid bruits.  Abdomen: no tenderness, no masses palpated. No hepatosplenomegaly. Bowel sounds positive.  Musculoskeletal: no  clubbing / cyanosis. No joint deformity upper and lower extremities. Good ROM, no contractures. Normal muscle tone.  Skin: no rashes, lesions, ulcers. No induration Neurologic: CN 2-12 grossly intact. Sensation intact, DTR normal. Strength 5/5 x all 4 extremities.  Psychiatric: Normal judgment and insight. Alert and oriented x 3. Normal mood.    Condition at discharge: good  The results of significant diagnostics from this hospitalization (including imaging, microbiology, ancillary and laboratory) are listed below for reference.   Imaging Studies: CT ABDOMEN PELVIS W CONTRAST Result Date: 11/20/2024 EXAM: CT ABDOMEN AND PELVIS WITH CONTRAST 11/20/2024 03:43:41 AM TECHNIQUE: CT of the abdomen and pelvis was performed with the administration of intravenous contrast. 75 mL of iohexol  (OMNIPAQUE ) 350 MG/ML injection was administered. Multiplanar reformatted images are provided for review. Automated exposure control, iterative reconstruction, and/or weight-based adjustment of the mA/kV was utilized to reduce the radiation dose to as low as reasonably achievable. COMPARISON: CT of the abdomen and pelvis dated 11/14/2024. CLINICAL HISTORY: Abdominal pain, acute, nonlocalized. FINDINGS: LOWER CHEST: Mild dependent atelectasis. There is a 2 mm peripheral nodule again demonstrated laterally within the right middle lobe on image 1 of series 5. There is also a 2 mm nodule present anterolaterally within the base of the left lower lobe on image 3.  There is a 3 to 4 mm nodule present laterally within the left lower lobe on image 7. LIVER: The liver is unremarkable. GALLBLADDER AND BILE DUCTS: The patient is status post cholecystectomy. No biliary ductal dilatation. SPLEEN: No acute abnormality. PANCREAS: No acute abnormality. ADRENAL GLANDS: No acute abnormality. KIDNEYS, URETERS AND BLADDER: There is a simple exophytic cyst arising medially from the superior pole of the left kidney. There are also small simple cysts  present within the right kidney. There is a 2 to 3 mm nonobstructive calculus also present within the lower pole of the right kidney. There are also a couple of 2 to 3 mm nonobstructive calculi within the lower pole of the left kidney. There is no evidence of obstructive uropathy. No hydronephrosis. No perinephric or periureteral stranding. Urinary bladder is unremarkable. Per consensus, no follow-up is needed for simple Bosniak type 1 and 2 renal cysts, unless the patient has a malignancy history or risk factors. GI AND BOWEL: Stomach demonstrates no acute abnormality. There is no bowel obstruction. PERITONEUM AND RETROPERITONEUM: No ascites. No free air. VASCULATURE: Aorta is normal in caliber. LYMPH NODES: No lymphadenopathy. REPRODUCTIVE ORGANS: The patient is status post hysterectomy and bilateral salpingo-oophorectomy. BONES AND SOFT TISSUES: Mild levoscoliosis of the thoracolumbar spine. No acute osseous abnormality. No focal soft tissue abnormality. IMPRESSION: 1. No acute findings in the abdomen or pelvis. 2. Multiple tiny pulmonary nodules measuring up to 3-4 mm. No routine follow-up imaging is recommended per Fleischner Society Guidelines (in a low-risk patient). 3. Bilateral nonobstructing nephrolithiasis. Simple bilateral renal cysts. Electronically signed by: Evalene Coho MD 11/20/2024 04:16 AM EST RP Workstation: [id number]    Microbiology: No results found for this or any previous visit.  Labs: CBC: Recent Labs  Lab 11/19/24 1529 11/21/24 0543  WBC 5.7 5.3  NEUTROABS 3.5  --   HGB 14.0 12.9  HCT 40.8 38.5  MCV 89.9 89.3  PLT 332 307   Basic Metabolic Panel: Recent Labs  Lab 11/19/24 1529 11/21/24 0543  NA 137 142  K 4.1 3.9  CL 100 106  CO2 26 27  GLUCOSE 116* 97  BUN 13 8  CREATININE 0.90 0.79  CALCIUM 9.7 9.3   Liver Function Tests: Recent Labs  Lab 11/19/24 1529 11/21/24 0543  AST 67* 51*  ALT 61* 57*  ALKPHOS 84 74  BILITOT 0.4 0.5  PROT 7.1 6.5   ALBUMIN 4.2 4.0   CBG: No results for input(s): GLUCAP in the last 168 hours.  Discharge time spent: 40 minutes.  Signed: Deliliah Room, MD [facility] 11/21/2024 "

## 2024-11-21 NOTE — Discharge Instructions (Signed)
 Jennifer Hutchinson

## 2024-11-21 NOTE — Plan of Care (Signed)
